# Patient Record
Sex: Male | Born: 1965 | Race: Black or African American | Hispanic: No | Marital: Married | Smoking: Current every day smoker
Health system: Southern US, Community
[De-identification: ages and names within clinical notes are randomized; demographics above are authoritative.]

## PROBLEM LIST (undated history)

## (undated) ENCOUNTER — Emergency Department (HOSPITAL_COMMUNITY): Admission: EM | Payer: Self-pay | Source: Home / Self Care

## (undated) DIAGNOSIS — K219 Gastro-esophageal reflux disease without esophagitis: Secondary | ICD-10-CM

## (undated) DIAGNOSIS — I10 Essential (primary) hypertension: Secondary | ICD-10-CM

## (undated) HISTORY — PX: KNEE SURGERY: SHX244

## (undated) HISTORY — PX: APPENDECTOMY: SHX54

## (undated) HISTORY — DX: Gastro-esophageal reflux disease without esophagitis: K21.9

---
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
## 2010-08-29 ENCOUNTER — Emergency Department (HOSPITAL_COMMUNITY)
Admission: EM | Admit: 2010-08-29 | Discharge: 2010-08-29 | Disposition: A | Discharge status: Home / Self Care | Payer: Self-pay | Attending: Emergency Medicine | Admitting: Emergency Medicine

## 2010-08-29 DIAGNOSIS — Z202 Contact with and (suspected) exposure to infections with a predominantly sexual mode of transmission: Secondary | ICD-10-CM | POA: Insufficient documentation

## 2010-11-27 ENCOUNTER — Inpatient Hospital Stay (INDEPENDENT_AMBULATORY_CARE_PROVIDER_SITE_OTHER)
Admission: RE | Admit: 2010-11-27 | Discharge: 2010-11-27 | Disposition: A | Discharge status: Home / Self Care | Payer: Self-pay | Source: Ambulatory Visit | Attending: Family Medicine | Admitting: Family Medicine

## 2010-11-27 DIAGNOSIS — L259 Unspecified contact dermatitis, unspecified cause: Secondary | ICD-10-CM

## 2010-11-27 DIAGNOSIS — L988 Other specified disorders of the skin and subcutaneous tissue: Secondary | ICD-10-CM

## 2011-01-21 ENCOUNTER — Ambulatory Visit (INDEPENDENT_AMBULATORY_CARE_PROVIDER_SITE_OTHER): Payer: Self-pay

## 2011-01-21 ENCOUNTER — Inpatient Hospital Stay (INDEPENDENT_AMBULATORY_CARE_PROVIDER_SITE_OTHER)
Admission: RE | Admit: 2011-01-21 | Discharge: 2011-01-21 | Disposition: A | Discharge status: Home / Self Care | Payer: Self-pay | Source: Ambulatory Visit | Attending: Emergency Medicine | Admitting: Emergency Medicine

## 2011-01-21 DIAGNOSIS — IMO0002 Reserved for concepts with insufficient information to code with codable children: Secondary | ICD-10-CM

## 2011-01-21 DIAGNOSIS — S8000XA Contusion of unspecified knee, initial encounter: Secondary | ICD-10-CM

## 2011-01-23 ENCOUNTER — Inpatient Hospital Stay (HOSPITAL_COMMUNITY)
Admission: RE | Admit: 2011-01-23 | Discharge: 2011-01-23 | Disposition: A | Discharge status: Home / Self Care | Payer: Self-pay | Source: Ambulatory Visit

## 2011-04-12 ENCOUNTER — Emergency Department (HOSPITAL_COMMUNITY)
Admission: EM | Admit: 2011-04-12 | Discharge: 2011-04-13 | Disposition: A | Discharge status: Home / Self Care | Payer: Self-pay | Attending: Emergency Medicine | Admitting: Emergency Medicine

## 2011-04-12 DIAGNOSIS — R059 Cough, unspecified: Secondary | ICD-10-CM | POA: Insufficient documentation

## 2011-04-12 DIAGNOSIS — R071 Chest pain on breathing: Secondary | ICD-10-CM | POA: Insufficient documentation

## 2011-04-12 DIAGNOSIS — Z87828 Personal history of other (healed) physical injury and trauma: Secondary | ICD-10-CM | POA: Insufficient documentation

## 2011-04-12 DIAGNOSIS — R05 Cough: Secondary | ICD-10-CM | POA: Insufficient documentation

## 2011-04-12 DIAGNOSIS — Z9889 Other specified postprocedural states: Secondary | ICD-10-CM | POA: Insufficient documentation

## 2011-04-12 DIAGNOSIS — R093 Abnormal sputum: Secondary | ICD-10-CM | POA: Insufficient documentation

## 2011-04-13 ENCOUNTER — Emergency Department (HOSPITAL_COMMUNITY): Payer: Self-pay

## 2016-01-09 ENCOUNTER — Encounter (HOSPITAL_COMMUNITY): Payer: Self-pay | Admitting: Emergency Medicine

## 2016-01-09 ENCOUNTER — Ambulatory Visit (HOSPITAL_COMMUNITY)
Admission: EM | Admit: 2016-01-09 | Discharge: 2016-01-09 | Disposition: A | Discharge status: Home / Self Care | Payer: Self-pay | Attending: Emergency Medicine | Admitting: Emergency Medicine

## 2016-01-09 DIAGNOSIS — I159 Secondary hypertension, unspecified: Secondary | ICD-10-CM

## 2016-01-09 DIAGNOSIS — Z760 Encounter for issue of repeat prescription: Secondary | ICD-10-CM

## 2016-01-09 HISTORY — DX: Essential (primary) hypertension: I10

## 2016-01-09 MED ORDER — HYDROCHLOROTHIAZIDE 25 MG PO TABS: 25.0000 mg | ORAL_TABLET | Freq: Every day | ORAL | Status: DC

## 2016-01-09 NOTE — ED Notes (Signed)
Diagnosed with htn 1999.  Patient released from prison 5 days ago.  Out of medicine for 4 days.  Says jail could not find his medicine

## 2016-01-09 NOTE — Discharge Instructions (Signed)
If you need help with getting financial assistance, assistance in getting medications or finding a primary care physican, contact Maggy Mena here at the Urgent [facility]. Her phone number is [phone].   [facility] [phone] [address] [place] [id number]  [facility]: [address] [place] [id number]  [phone]  [facility]: [address] [place] [id number]   [phone]  [facility]: [address] [place] [address]  [phone]  [facility] : [address] [place] [id number] [phone]  [facility]: [address] [place] [phone]  [facility]: [address] [place] [id number] [phone]  Dr. Oneal GroutMahima Pandey [address] [facility] [place] [id number]  [phone]  Dr. Jackie PlumGeorge Osei-Bonsu, [facility]. [address] [place] [address]  [phone]     Decrease your salt intake. diet and exercise will lower your blood pressure significantly. It is important to keep your blood pressure under good control, as having a elevated for prolonged periods of time significantly increases your risk of stroke, heart attacks, kidney damage, eye damage, and other problems. Return here in a week for blood pressure recheck if you're able to find a primary care physician by then. Return immediately to the ER if you start having chest pain, headache, problems seeing, problems talking, problems walking, if you feel like you're about to pass out, if you do pass out, if you have a seizure, or for any other concerns..Marland Kitchen

## 2016-01-09 NOTE — ED Provider Notes (Signed)
  HPI  SUBJECTIVE:  Bruce [name] is a 50 y.o. male who presents for medication refill. Patient states that he ran out of his hydrochlorothiazide 4 days ago. States he has been on this for a long time in the controls his hypertension well. Today he is asymptomatic. He denies headache, visual changes, dysarthria, discoordination, arm or leg weakness, facial droop, chest pain, shortness of breath, abdominal pain, pain tearing through to his back, he anuria, hematuria, lower extremity edema, seizures, syncope. States he just got out of jail, and the present lost his prescription. Past medical history of hypertension, gout. No history of stroke, MI. No illicit drugs. PMD: None.    Past Medical History  Diagnosis Date  . Hypertension     Past Surgical History  Procedure Laterality Date  . Appendectomy    . Knee surgery Bilateral     Family History  Problem Relation Age of Onset  . Hypertension Mother   . Hypertension Father     Social History  Substance Use Topics  . Smoking status: Current Every Day Smoker  . Smokeless tobacco: None  . Alcohol Use: Yes    No current facility-administered medications for this encounter.  Current outpatient prescriptions:  .  hydrochlorothiazide (HYDRODIURIL) 25 MG tablet, Take 1 tablet (25 mg total) by mouth daily., Disp: 30 tablet, Rfl: 0  No Known Allergies   ROS  As noted in HPI.   Physical Exam  BP 145/85 mmHg  Pulse 84  Temp(Src) 98.1 F (36.7 C) (Oral)  Resp 18  SpO2 98%   BP Readings from Last 3 Encounters:  01/09/16 145/85    Constitutional: Well developed, well nourished, no acute distress Eyes: PERRL, EOMI, conjunctiva normal bilaterally HENT: Normocephalic, atraumatic,mucus membranes moist Respiratory: Clear to auscultation bilaterally, no rales, no wheezing, no rhonchi Cardiovascular: Normal rate and rhythm, no murmurs, no gallops, no rubs GI: Soft, nondistended, normal bowel sounds, nontender, no rebound, no  guarding skin: No rash, skin intact Musculoskeletal: No edema, no tenderness, no deformities Neurologic: Alert & oriented x 3, CN II-XII grossly intact, no motor deficits, sensation grossly intact Psychiatric: Speech and behavior appropriate   ED Course   Medications - No data to display  No orders of the defined types were placed in this encounter.   No results found for this or any previous visit (from the past 24 hour(s)). No results found.  ED Clinical Impression  Secondary hypertension, unspecified  Medication refill   ED Assessment/Plan  Patient is symptomatic. States the HCTZ controls his blood pressure well. We'll restart this, have him keep a log of his blood pressures, Mat CarneMaggie Mina is helping him find a primary care physician. We'll also give a list of primary care practices.  Discussed  MDM, plan and followup with patient. Discussed sn/sx that should prompt return to the ED. Patient  agrees with plan.  *This clinic note was created using Dragon dictation software. Therefore, there may be occasional mistakes despite careful proofreading.  ?  Domenick GongAshley Chayce Rullo, MD 01/10/16 [phone]

## 2016-02-22 ENCOUNTER — Ambulatory Visit (HOSPITAL_COMMUNITY)
Admission: EM | Admit: 2016-02-22 | Discharge: 2016-02-22 | Disposition: A | Discharge status: Home / Self Care | Payer: Self-pay | Attending: Family Medicine | Admitting: Family Medicine

## 2016-02-22 ENCOUNTER — Encounter (HOSPITAL_COMMUNITY): Payer: Self-pay | Admitting: Emergency Medicine

## 2016-02-22 DIAGNOSIS — E860 Dehydration: Secondary | ICD-10-CM

## 2016-02-22 DIAGNOSIS — K2970 Gastritis, unspecified, without bleeding: Secondary | ICD-10-CM

## 2016-02-22 MED ORDER — HYDROCHLOROTHIAZIDE 25 MG PO TABS: 25.0000 mg | ORAL_TABLET | Freq: Every day | ORAL | 1 refills | Status: DC

## 2016-02-22 MED ORDER — ONDANSETRON HCL 4 MG PO TABS: 4.0000 mg | ORAL_TABLET | Freq: Four times a day (QID) | ORAL | 0 refills | Status: DC

## 2016-02-22 NOTE — ED Triage Notes (Addendum)
The patient presented to the [facility] with a complaint of N/V/D for the last 36 hours. The patient also requested a refill of his HCTZ.

## 2016-02-22 NOTE — ED Provider Notes (Signed)
CSN: [id number]     Arrival date & time 02/22/16  4098 History   First MD Initiated Contact with Patient 02/22/16 1027     Chief Complaint  Patient presents with  . Nausea   (Consider location/radiation/quality/duration/timing/severity/associated sxs/prior Treatment) HPI Patient is a 50 year old male who started with nausea vomiting 2 days ago. He still complains of nausea although the vomiting has ceased at this time. He states that he is having difficulty taking fluids because he is so nauseous. He is not eating at this time. He denies any significant abdominal pain. Denies fever or blood in his vomitus. He does have a history of hypertension. He is also out of his hydrochlorothiazide and is asking for a refill for this. Past Medical History:  Diagnosis Date  . Hypertension    Past Surgical History:  Procedure Laterality Date  . APPENDECTOMY    . KNEE SURGERY Bilateral    Family History  Problem Relation Age of Onset  . Hypertension Mother   . Hypertension Father    Social History  Substance Use Topics  . Smoking status: Current Every Day Smoker  . Smokeless tobacco: Never Used  . Alcohol use Yes    Review of Systems  Denies: HEADACHE,   ABDOMINAL PAIN, CHEST PAIN, CONGESTION, DYSURIA, SHORTNESS OF BREATH  Allergies  Review of patient's allergies indicates no known allergies.  Home Medications   Prior to Admission medications   Medication Sig Start Date End Date Taking? Authorizing Provider  hydrochlorothiazide (HYDRODIURIL) 25 MG tablet Take 1 tablet (25 mg total) by mouth daily. 01/09/16  Yes Domenick Gong, MD  hydrochlorothiazide (HYDRODIURIL) 25 MG tablet Take 1 tablet (25 mg total) by mouth daily. 02/22/16   Tharon Aquas, PA  ondansetron (ZOFRAN) 4 MG tablet Take 1 tablet (4 mg total) by mouth every 6 (six) hours. 02/22/16   Tharon Aquas, PA   Meds Ordered and Administered this Visit  Medications - No data to display  There were no vitals taken for this  visit. No data found.   Physical Exam NURSES NOTES AND VITAL SIGNS REVIEWED. CONSTITUTIONAL: Well developed, well nourished, no acute distress HEENT: normocephalic, atraumatic EYES: Conjunctiva normal NECK:normal ROM, supple, no adenopathy PULMONARY:No respiratory distress, normal effort ABDOMINAL: Soft, ND, NT BS+, No CVAT MUSCULOSKELETAL: Normal ROM of all extremities,  SKIN: warm and dry without rash PSYCHIATRIC: Mood and affect, behavior are normal  Urgent Care Course   Clinical Course    Procedures (including critical care time)  Labs Review Labs Reviewed - No data to display  Imaging Review No results found.   Visual Acuity Review  Right Eye Distance:   Left Eye Distance:   Bilateral Distance:    Right Eye Near:   Left Eye Near:    Bilateral Near:       Although patient meets criteria for clinical diagnosis of dehydration he is alert and oriented to this time and does not require parenteral hydration. I think after taking the Zofran he should be able to orally hydrate himself properly. He is advised that if there are new or worsening symptoms he should go to the emergency department for reevaluation. Prescriptions for hydrochlorothiazide and Zofran are provided to the patient. MDM   1. Gastritis   2. Dehydration     Patient is reassured that there are no issues that require transfer to higher level of care at this time or additional tests. Patient is advised to continue home symptomatic treatment. Patient is advised that if there  are new or worsening symptoms to attend the emergency department, contact primary care provider, or return to UC. Instructions of care provided discharged home in stable condition.    THIS NOTE WAS GENERATED USING A VOICE RECOGNITION SOFTWARE PROGRAM. ALL REASONABLE EFFORTS  WERE MADE TO PROOFREAD THIS DOCUMENT FOR ACCURACY.  I have verbally reviewed the discharge instructions with the patient. A printed AVS was given to the  patient.  All questions were answered prior to discharge.      Tharon Aquas, PA 02/22/16 1118

## 2016-02-25 ENCOUNTER — Encounter (HOSPITAL_COMMUNITY): Payer: Self-pay

## 2016-02-25 ENCOUNTER — Emergency Department (HOSPITAL_COMMUNITY)
Admission: EM | Admit: 2016-02-25 | Discharge: 2016-02-25 | Disposition: A | Discharge status: Home / Self Care | Payer: Self-pay | Attending: Emergency Medicine | Admitting: Emergency Medicine

## 2016-02-25 DIAGNOSIS — F17210 Nicotine dependence, cigarettes, uncomplicated: Secondary | ICD-10-CM | POA: Insufficient documentation

## 2016-02-25 DIAGNOSIS — R112 Nausea with vomiting, unspecified: Secondary | ICD-10-CM | POA: Insufficient documentation

## 2016-02-25 DIAGNOSIS — E860 Dehydration: Secondary | ICD-10-CM | POA: Insufficient documentation

## 2016-02-25 DIAGNOSIS — I10 Essential (primary) hypertension: Secondary | ICD-10-CM | POA: Insufficient documentation

## 2016-02-25 LAB — COMPREHENSIVE METABOLIC PANEL
ALK PHOS: 69 U/L (ref 38–126)
ALT: 20 U/L (ref 17–63)
AST: 24 U/L (ref 15–41)
Albumin: 4.2 g/dL (ref 3.5–5.0)
Anion gap: 11 (ref 5–15)
BILIRUBIN TOTAL: 1.4 mg/dL — AB (ref 0.3–1.2)
BUN: 14 mg/dL (ref 6–20)
CALCIUM: 9.6 mg/dL (ref 8.9–10.3)
CO2: 26 mmol/L (ref 22–32)
CREATININE: 1.3 mg/dL — AB (ref 0.61–1.24)
Chloride: 101 mmol/L (ref 101–111)
Glucose, Bld: 128 mg/dL — ABNORMAL HIGH (ref 65–99)
Potassium: 3 mmol/L — ABNORMAL LOW (ref 3.5–5.1)
Sodium: 138 mmol/L (ref 135–145)
Total Protein: 7.6 g/dL (ref 6.5–8.1)

## 2016-02-25 LAB — URINALYSIS, ROUTINE W REFLEX MICROSCOPIC
Glucose, UA: NEGATIVE mg/dL
HGB URINE DIPSTICK: NEGATIVE
KETONES UR: 15 mg/dL — AB
LEUKOCYTES UA: NEGATIVE
Nitrite: NEGATIVE
PROTEIN: NEGATIVE mg/dL
Specific Gravity, Urine: 1.031 — ABNORMAL HIGH (ref 1.005–1.030)
pH: 6.5 (ref 5.0–8.0)

## 2016-02-25 LAB — CBC
HCT: 50.1 % (ref 39.0–52.0)
Hemoglobin: 17.8 g/dL — ABNORMAL HIGH (ref 13.0–17.0)
MCH: 33.3 pg (ref 26.0–34.0)
MCHC: 35.5 g/dL (ref 30.0–36.0)
MCV: 93.8 fL (ref 78.0–100.0)
PLATELETS: 234 10*3/uL (ref 150–400)
RBC: 5.34 MIL/uL (ref 4.22–5.81)
RDW: 13.7 % (ref 11.5–15.5)
WBC: 12.4 10*3/uL — AB (ref 4.0–10.5)

## 2016-02-25 LAB — LIPASE, BLOOD: Lipase: 23 U/L (ref 11–51)

## 2016-02-25 MED ORDER — POTASSIUM CHLORIDE CRYS ER 20 MEQ PO TBCR
40.0000 meq | EXTENDED_RELEASE_TABLET | Freq: Once | ORAL | Status: AC
  Administered 2016-02-25: 40 meq via ORAL
  Filled 2016-02-25: qty 2

## 2016-02-25 MED ORDER — ALBUTEROL SULFATE (2.5 MG/3ML) 0.083% IN NEBU
2.5000 mg | INHALATION_SOLUTION | Freq: Once | RESPIRATORY_TRACT | Status: AC
  Administered 2016-02-25: 2.5 mg via RESPIRATORY_TRACT
  Filled 2016-02-25: qty 3

## 2016-02-25 MED ORDER — PROMETHAZINE HCL 25 MG/ML IJ SOLN
25.0000 mg | Freq: Once | INTRAMUSCULAR | Status: AC
  Administered 2016-02-25: 25 mg via INTRAVENOUS
  Filled 2016-02-25: qty 1

## 2016-02-25 MED ORDER — SODIUM CHLORIDE 0.9 % IV BOLUS (SEPSIS)
1000.0000 mL | Freq: Once | INTRAVENOUS | Status: AC
  Administered 2016-02-25: 1000 mL via INTRAVENOUS

## 2016-02-25 MED ORDER — ONDANSETRON 4 MG PO TBDP
ORAL_TABLET | ORAL | Status: DC
Start: 2016-02-25 — End: 2016-02-26
  Filled 2016-02-25: qty 1

## 2016-02-25 MED ORDER — PROMETHAZINE HCL 25 MG PO TABS: 25.0000 mg | ORAL_TABLET | Freq: Four times a day (QID) | ORAL | 0 refills | Status: DC | PRN

## 2016-02-25 MED ORDER — ONDANSETRON 4 MG PO TBDP
4.0000 mg | ORAL_TABLET | Freq: Once | ORAL | Status: AC
  Administered 2016-02-25: 4 mg via ORAL

## 2016-02-25 MED ORDER — ONDANSETRON HCL 4 MG/2ML IJ SOLN
4.0000 mg | Freq: Once | INTRAMUSCULAR | Status: AC
  Administered 2016-02-25: 4 mg via INTRAVENOUS
  Filled 2016-02-25: qty 2

## 2016-02-25 NOTE — ED Triage Notes (Signed)
To triage via EMS.  Onset 02-20-16 nausea, vomiting, diarrhea.  Vomited x 4 today, last diarrhea last night.  Seen at urgent care 02-22-16 was given prescription for blood pressure med and Zofran.  Pt taking alka seltzer for allergies d/t sinus drainage issues r/t chronic nasal issues.
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
## 2016-02-25 NOTE — ED Notes (Signed)
Family at bedside. 

## 2016-02-25 NOTE — ED Notes (Signed)
Pt departed in NAD, refused use of wheelchair.  

## 2016-02-25 NOTE — ED Notes (Signed)
Pt's nausea reassessed at this time.  Pt reports continued nausea at this time but states that he has been able "to drink some soda and it hasn't come back up yet."

## 2016-05-05 ENCOUNTER — Emergency Department (HOSPITAL_COMMUNITY)
Admission: EM | Admit: 2016-05-05 | Discharge: 2016-05-05 | Disposition: A | Discharge status: Home / Self Care | Payer: Self-pay | Attending: Emergency Medicine | Admitting: Emergency Medicine

## 2016-05-05 ENCOUNTER — Encounter (HOSPITAL_COMMUNITY): Payer: Self-pay | Admitting: Emergency Medicine

## 2016-05-05 DIAGNOSIS — R1084 Generalized abdominal pain: Secondary | ICD-10-CM | POA: Insufficient documentation

## 2016-05-05 DIAGNOSIS — I10 Essential (primary) hypertension: Secondary | ICD-10-CM | POA: Insufficient documentation

## 2016-05-05 DIAGNOSIS — F17210 Nicotine dependence, cigarettes, uncomplicated: Secondary | ICD-10-CM | POA: Insufficient documentation

## 2016-05-05 DIAGNOSIS — R030 Elevated blood-pressure reading, without diagnosis of hypertension: Secondary | ICD-10-CM

## 2016-05-05 LAB — COMPREHENSIVE METABOLIC PANEL
ALBUMIN: 4 g/dL (ref 3.5–5.0)
ALK PHOS: 66 U/L (ref 38–126)
ALT: 29 U/L (ref 17–63)
AST: 27 U/L (ref 15–41)
Anion gap: 10 (ref 5–15)
BILIRUBIN TOTAL: 1.2 mg/dL (ref 0.3–1.2)
BUN: 14 mg/dL (ref 6–20)
CALCIUM: 9.5 mg/dL (ref 8.9–10.3)
CO2: 31 mmol/L (ref 22–32)
Chloride: 95 mmol/L — ABNORMAL LOW (ref 101–111)
Creatinine, Ser: 1.34 mg/dL — ABNORMAL HIGH (ref 0.61–1.24)
GFR calc Af Amer: >60 mL/min (ref >60)
GFR calc non Af Amer: >60 mL/min (ref >60)
GLUCOSE: 119 mg/dL — AB (ref 65–99)
POTASSIUM: 3.5 mmol/L (ref 3.5–5.1)
SODIUM: 136 mmol/L (ref 135–145)
TOTAL PROTEIN: 7.5 g/dL (ref 6.5–8.1)

## 2016-05-05 LAB — URINALYSIS, ROUTINE W REFLEX MICROSCOPIC
Bilirubin Urine: NEGATIVE
GLUCOSE, UA: NEGATIVE mg/dL
HGB URINE DIPSTICK: NEGATIVE
Ketones, ur: NEGATIVE mg/dL
LEUKOCYTES UA: NEGATIVE
Nitrite: NEGATIVE
Protein, ur: NEGATIVE mg/dL
SPECIFIC GRAVITY, URINE: 1.024 (ref 1.005–1.030)
pH: 6 (ref 5.0–8.0)

## 2016-05-05 LAB — CBC
HEMATOCRIT: 48.2 % (ref 39.0–52.0)
HEMOGLOBIN: 17.3 g/dL — AB (ref 13.0–17.0)
MCH: 33.5 pg (ref 26.0–34.0)
MCHC: 35.9 g/dL (ref 30.0–36.0)
MCV: 93.2 fL (ref 78.0–100.0)
Platelets: 245 10*3/uL (ref 150–400)
RBC: 5.17 MIL/uL (ref 4.22–5.81)
RDW: 12.6 % (ref 11.5–15.5)
WBC: 12.3 10*3/uL — ABNORMAL HIGH (ref 4.0–10.5)

## 2016-05-05 LAB — CBG MONITORING, ED: Glucose-Capillary: 101 mg/dL — ABNORMAL HIGH (ref 65–99)

## 2016-05-05 LAB — I-STAT CG4 LACTIC ACID, ED: LACTIC ACID, VENOUS: 0.78 mmol/L (ref 0.5–1.9)

## 2016-05-05 LAB — LIPASE, BLOOD: Lipase: 16 U/L (ref 11–51)

## 2016-05-05 MED ORDER — GI COCKTAIL ~~LOC~~
30.0000 mL | Freq: Once | ORAL | Status: AC
  Administered 2016-05-05: 30 mL via ORAL
  Filled 2016-05-05: qty 30

## 2016-05-05 MED ORDER — SUCRALFATE 1 GM/10ML PO SUSP: 1.0000 g | Freq: Three times a day (TID) | ORAL | 0 refills | Status: DC

## 2016-05-05 MED ORDER — FAMOTIDINE IN NACL 20-0.9 MG/50ML-% IV SOLN
20.0000 mg | Freq: Once | INTRAVENOUS | Status: AC
  Administered 2016-05-05: 20 mg via INTRAVENOUS
  Filled 2016-05-05: qty 50

## 2016-05-05 MED ORDER — FAMOTIDINE 20 MG PO TABS: 20.0000 mg | ORAL_TABLET | Freq: Two times a day (BID) | ORAL | 0 refills | Status: DC

## 2016-05-05 MED ORDER — ONDANSETRON HCL 4 MG/2ML IJ SOLN
4.0000 mg | Freq: Once | INTRAMUSCULAR | Status: AC
  Administered 2016-05-05: 4 mg via INTRAVENOUS
  Filled 2016-05-05: qty 2

## 2016-05-05 MED ORDER — DICYCLOMINE HCL 10 MG PO CAPS: 20.0000 mg | ORAL_CAPSULE | Freq: Four times a day (QID) | ORAL | 0 refills | Status: DC | PRN

## 2016-05-05 MED ORDER — DICYCLOMINE HCL 10 MG PO CAPS
10.0000 mg | ORAL_CAPSULE | Freq: Once | ORAL | Status: AC
  Administered 2016-05-05: 10 mg via ORAL
  Filled 2016-05-05: qty 1
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
## 2016-05-05 NOTE — ED Triage Notes (Signed)
Pt states last Friday he started vomiting a lot. Pt has been nauseated since then and has not been able to eat much. Pt states he started feeling better yesterday. This morning, pt states he drank Gatorade and ate a fruit cup. Pt states he started sweating, feeling nauseous again. Pt concerned he has high BP and CBG because it runs in his family.

## 2016-05-05 NOTE — Discharge Instructions (Signed)
Do not take any NSAIDs (motrin, ibuprofen, advil, aleve, excedrin, aspirin, naproxen, goody's powder,  etc.) because they can further irritate your stomach.  ° °Please follow with your primary care doctor in the next 2 days for a check-up. They must obtain records for further management.  ° °Do not hesitate to return to the Emergency Department for any new, worsening or concerning symptoms.  ° ° °

## 2016-05-05 NOTE — ED Notes (Signed)
Patient had nausea/vomiting 3 days ago. Currently he denies abdominal pain and nausea. But just not feeling quite right. He was able to tolerate regular dinner last night. Concerned about blood sugar d/t DM running in family.

## 2016-05-05 NOTE — Care Management Note (Signed)
Case Management Note  Patient Details  Name: Bruce [name] MRN: [id number] Date of Birth: 02/13/1966  Subjective/Objective:  50 y.o. [name] seen in the ED for abdominal pain. CM received referral for PCP.                   Action/Plan: Referred to [facility]. Pt understands that he can go to open clinic hours on Thursdays @0830  and will be prepared to stay until he is seen. No further CM needs at this time.    Expected Discharge Date:                  Expected Discharge Plan:     In-House Referral:     Discharge planning Services     Post Acute Care Choice:    Choice offered to:     DME Arranged:    DME Agency:     HH Arranged:    HH Agency:     Status of Service:     If discussed at [employer] Meetings, dates discussed:    Additional Comments:  Bruce [name], Bruce Yott M, RN 05/05/2016, 11:45 AM

## 2016-05-05 NOTE — ED Notes (Signed)
CBG 101 

## 2018-11-12 ENCOUNTER — Ambulatory Visit (HOSPITAL_COMMUNITY)
Admission: EM | Admit: 2018-11-12 | Discharge: 2018-11-12 | Disposition: A | Discharge status: Home / Self Care | Payer: BLUE CROSS/BLUE SHIELD

## 2018-11-12 ENCOUNTER — Encounter (HOSPITAL_COMMUNITY): Payer: Self-pay | Admitting: Family Medicine

## 2018-11-12 ENCOUNTER — Other Ambulatory Visit: Payer: Self-pay

## 2018-11-12 DIAGNOSIS — I10 Essential (primary) hypertension: Secondary | ICD-10-CM

## 2018-11-12 MED ORDER — LISINOPRIL-HYDROCHLOROTHIAZIDE 20-12.5 MG PO TABS: 1.0000 | ORAL_TABLET | Freq: Every day | ORAL | 2 refills | Status: DC

## 2018-11-12 MED ORDER — CETIRIZINE HCL 10 MG PO TABS: 10.0000 mg | ORAL_TABLET | Freq: Every day | ORAL | 0 refills | Status: DC

## 2018-11-12 NOTE — Discharge Instructions (Addendum)
We are refilling your BP meds and 2 refills Giving you a contact for primary care Zyrtec for allergies.
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
## 2018-11-12 NOTE — ED Triage Notes (Signed)
Patient presents to Urgent Care with complaints of productive cough and need for a medication refill since 2 days ago. Patient states he just got out of prison and needs his lisinopril refilled.

## 2018-11-12 NOTE — ED Notes (Signed)
Patient verbalizes understanding of discharge instructions. Opportunity for questioning and answers were provided. Patient discharged from [facility] by provider.  

## 2019-05-01 ENCOUNTER — Encounter (HOSPITAL_COMMUNITY): Payer: Self-pay

## 2019-05-01 ENCOUNTER — Other Ambulatory Visit: Payer: Self-pay

## 2019-05-01 ENCOUNTER — Ambulatory Visit (HOSPITAL_COMMUNITY)
Admission: EM | Admit: 2019-05-01 | Discharge: 2019-05-01 | Disposition: A | Discharge status: Home / Self Care | Payer: BLUE CROSS/BLUE SHIELD | Attending: Family Medicine | Admitting: Family Medicine

## 2019-05-01 DIAGNOSIS — Z760 Encounter for issue of repeat prescription: Secondary | ICD-10-CM | POA: Diagnosis present

## 2019-05-01 DIAGNOSIS — J302 Other seasonal allergic rhinitis: Secondary | ICD-10-CM | POA: Diagnosis not present

## 2019-05-01 DIAGNOSIS — Z20828 Contact with and (suspected) exposure to other viral communicable diseases: Secondary | ICD-10-CM | POA: Diagnosis present

## 2019-05-01 DIAGNOSIS — I10 Essential (primary) hypertension: Secondary | ICD-10-CM | POA: Insufficient documentation

## 2019-05-01 DIAGNOSIS — R062 Wheezing: Secondary | ICD-10-CM | POA: Diagnosis present

## 2019-05-01 MED ORDER — LISINOPRIL-HYDROCHLOROTHIAZIDE 20-12.5 MG PO TABS: 1.0000 | ORAL_TABLET | Freq: Every day | ORAL | 0 refills | Status: DC

## 2019-05-01 MED ORDER — ALBUTEROL SULFATE HFA 108 (90 BASE) MCG/ACT IN AERS: 2.0000 | INHALATION_SPRAY | RESPIRATORY_TRACT | 0 refills | Status: DC | PRN

## 2019-05-01 MED ORDER — CETIRIZINE HCL 10 MG PO TABS: 10.0000 mg | ORAL_TABLET | Freq: Every day | ORAL | 0 refills | Status: DC
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
## 2019-05-01 NOTE — ED Triage Notes (Signed)
Patient presents to Urgent Care with complaints of needing a refill on his blood pressure medication since running out over a month ago. Patient reports he also has a cough with nasal drainage that he says he gets every year around this time.

## 2019-05-01 NOTE — Discharge Instructions (Signed)
Follow up with PCP for management of your blood pressure.  Take medication as prescribed. Go to ER if you experience acute headache, weakness, numbness, or tingling, especially on 1/2 of your body. Lab results will be available via MyChart in 2 - 3 days.

## 2019-05-03 LAB — NOVEL CORONAVIRUS, NAA (HOSP ORDER, SEND-OUT TO REF LAB; TAT 18-24 HRS): SARS-CoV-2, NAA: NOT DETECTED

## 2019-05-26 ENCOUNTER — Ambulatory Visit: Payer: BLUE CROSS/BLUE SHIELD

## 2019-10-02 ENCOUNTER — Emergency Department (HOSPITAL_COMMUNITY): Payer: BLUE CROSS/BLUE SHIELD

## 2019-10-02 ENCOUNTER — Encounter (HOSPITAL_COMMUNITY): Payer: Self-pay | Admitting: Emergency Medicine

## 2019-10-02 ENCOUNTER — Other Ambulatory Visit: Payer: Self-pay

## 2019-10-02 ENCOUNTER — Emergency Department (HOSPITAL_COMMUNITY)
Admission: EM | Admit: 2019-10-02 | Discharge: 2019-10-02 | Disposition: A | Discharge status: Home / Self Care | Payer: BLUE CROSS/BLUE SHIELD | Attending: Emergency Medicine | Admitting: Emergency Medicine

## 2019-10-02 DIAGNOSIS — Z79899 Other long term (current) drug therapy: Secondary | ICD-10-CM | POA: Insufficient documentation

## 2019-10-02 DIAGNOSIS — Z20822 Contact with and (suspected) exposure to covid-19: Secondary | ICD-10-CM | POA: Diagnosis not present

## 2019-10-02 DIAGNOSIS — I10 Essential (primary) hypertension: Secondary | ICD-10-CM | POA: Diagnosis not present

## 2019-10-02 DIAGNOSIS — R05 Cough: Secondary | ICD-10-CM | POA: Insufficient documentation

## 2019-10-02 DIAGNOSIS — F17210 Nicotine dependence, cigarettes, uncomplicated: Secondary | ICD-10-CM | POA: Insufficient documentation

## 2019-10-02 LAB — COMPREHENSIVE METABOLIC PANEL
ALT: 33 U/L (ref 0–44)
AST: 32 U/L (ref 15–41)
Albumin: 4.3 g/dL (ref 3.5–5.0)
Alkaline Phosphatase: 86 U/L (ref 38–126)
Anion gap: 13 (ref 5–15)
BUN: 15 mg/dL (ref 6–20)
CO2: 23 mmol/L (ref 22–32)
Calcium: 10.1 mg/dL (ref 8.9–10.3)
Chloride: 106 mmol/L (ref 98–111)
Creatinine, Ser: 1.29 mg/dL — ABNORMAL HIGH (ref 0.61–1.24)
GFR calc Af Amer: >60 mL/min (ref >60)
GFR calc non Af Amer: >60 mL/min (ref >60)
Glucose, Bld: 128 mg/dL — ABNORMAL HIGH (ref 70–99)
Potassium: 4.1 mmol/L (ref 3.5–5.1)
Sodium: 142 mmol/L (ref 135–145)
Total Bilirubin: 1.1 mg/dL (ref 0.3–1.2)
Total Protein: 7.5 g/dL (ref 6.5–8.1)

## 2019-10-02 LAB — URINALYSIS, ROUTINE W REFLEX MICROSCOPIC
Bilirubin Urine: NEGATIVE
Glucose, UA: NEGATIVE mg/dL
Ketones, ur: NEGATIVE mg/dL
Leukocytes,Ua: NEGATIVE
Nitrite: NEGATIVE
Protein, ur: 30 mg/dL — AB
Specific Gravity, Urine: 1.025 (ref 1.005–1.030)
pH: 5 (ref 5.0–8.0)

## 2019-10-02 LAB — CBC
HCT: 49.1 % (ref 39.0–52.0)
Hemoglobin: 16.3 g/dL (ref 13.0–17.0)
MCH: 32.9 pg (ref 26.0–34.0)
MCHC: 33.2 g/dL (ref 30.0–36.0)
MCV: 99.2 fL (ref 80.0–100.0)
Platelets: 246 10*3/uL (ref 150–400)
RBC: 4.95 MIL/uL (ref 4.22–5.81)
RDW: 13 % (ref 11.5–15.5)
WBC: 13.2 10*3/uL — ABNORMAL HIGH (ref 4.0–10.5)
nRBC: 0 % (ref 0.0–0.2)

## 2019-10-02 LAB — POC SARS CORONAVIRUS 2 AG -  ED: SARS Coronavirus 2 Ag: NEGATIVE

## 2019-10-02 LAB — SARS CORONAVIRUS 2 (TAT 6-24 HRS): SARS Coronavirus 2: NEGATIVE

## 2019-10-02 LAB — LIPASE, BLOOD: Lipase: 21 U/L (ref 11–51)

## 2019-10-02 MED ORDER — BENZONATATE 100 MG PO CAPS: 100.0000 mg | ORAL_CAPSULE | Freq: Three times a day (TID) | ORAL | 0 refills | Status: DC

## 2019-10-02 MED ORDER — SODIUM CHLORIDE 0.9% FLUSH
3.0000 mL | Freq: Once | INTRAVENOUS | Status: AC
  Administered 2019-10-02: 3 mL via INTRAVENOUS

## 2019-10-02 MED ORDER — SODIUM CHLORIDE 0.9 % IV BOLUS
1000.0000 mL | Freq: Once | INTRAVENOUS | Status: AC
  Administered 2019-10-02: 1000 mL via INTRAVENOUS

## 2019-10-02 MED ORDER — HALOPERIDOL LACTATE 5 MG/ML IJ SOLN
5.0000 mg | Freq: Once | INTRAMUSCULAR | Status: AC
  Administered 2019-10-02: 5 mg via INTRAVENOUS
  Filled 2019-10-02: qty 1

## 2019-10-02 MED ORDER — ONDANSETRON HCL 4 MG PO TABS: 4.0000 mg | ORAL_TABLET | Freq: Three times a day (TID) | ORAL | 0 refills | Status: DC | PRN

## 2019-10-02 MED ORDER — MORPHINE SULFATE (PF) 4 MG/ML IV SOLN
4.0000 mg | Freq: Once | INTRAVENOUS | Status: AC
  Administered 2019-10-02: 4 mg via INTRAVENOUS
  Filled 2019-10-02: qty 1

## 2019-10-02 MED ORDER — ONDANSETRON HCL 4 MG/2ML IJ SOLN
4.0000 mg | Freq: Once | INTRAMUSCULAR | Status: AC
  Administered 2019-10-02: 12:00:00 4 mg via INTRAVENOUS
  Filled 2019-10-02: qty 2

## 2019-10-02 NOTE — ED Triage Notes (Signed)
Pt with c/o emesis, chills, body aches since last night.

## 2019-10-02 NOTE — Discharge Instructions (Signed)
Your symptoms may be due to covid-19 infection.  Check your result in the next 24 hrs through MyChart, link below.  Your symptoms may also be due to marijuana use.  Avoid drug use as it may worsening your condition.  Take zofran as needed.  Drink plenty of fluid.  Return if you have any concerns.

## 2019-10-02 NOTE — ED Notes (Signed)
Pt awake and alert with recurrent vomiting.  PA notified.
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
## 2019-10-06 ENCOUNTER — Encounter (HOSPITAL_COMMUNITY): Payer: Self-pay | Admitting: Emergency Medicine

## 2019-10-06 ENCOUNTER — Emergency Department (HOSPITAL_COMMUNITY): Payer: BLUE CROSS/BLUE SHIELD

## 2019-10-06 ENCOUNTER — Other Ambulatory Visit: Payer: Self-pay

## 2019-10-06 ENCOUNTER — Inpatient Hospital Stay (HOSPITAL_COMMUNITY)
Admission: EM | Admit: 2019-10-06 | Discharge: 2019-10-09 | DRG: 684 | Disposition: A | Discharge status: Home / Self Care | Payer: BLUE CROSS/BLUE SHIELD | Attending: Internal Medicine | Admitting: Internal Medicine

## 2019-10-06 DIAGNOSIS — Z8249 Family history of ischemic heart disease and other diseases of the circulatory system: Secondary | ICD-10-CM

## 2019-10-06 DIAGNOSIS — R079 Chest pain, unspecified: Secondary | ICD-10-CM | POA: Diagnosis present

## 2019-10-06 DIAGNOSIS — Z20822 Contact with and (suspected) exposure to covid-19: Secondary | ICD-10-CM | POA: Diagnosis present

## 2019-10-06 DIAGNOSIS — I10 Essential (primary) hypertension: Secondary | ICD-10-CM | POA: Diagnosis present

## 2019-10-06 DIAGNOSIS — R112 Nausea with vomiting, unspecified: Secondary | ICD-10-CM

## 2019-10-06 DIAGNOSIS — E860 Dehydration: Secondary | ICD-10-CM | POA: Diagnosis present

## 2019-10-06 DIAGNOSIS — R34 Anuria and oliguria: Secondary | ICD-10-CM | POA: Diagnosis not present

## 2019-10-06 DIAGNOSIS — Z880 Allergy status to penicillin: Secondary | ICD-10-CM

## 2019-10-06 DIAGNOSIS — K219 Gastro-esophageal reflux disease without esophagitis: Secondary | ICD-10-CM | POA: Diagnosis present

## 2019-10-06 DIAGNOSIS — F17210 Nicotine dependence, cigarettes, uncomplicated: Secondary | ICD-10-CM | POA: Diagnosis present

## 2019-10-06 DIAGNOSIS — F1290 Cannabis use, unspecified, uncomplicated: Secondary | ICD-10-CM | POA: Diagnosis present

## 2019-10-06 DIAGNOSIS — I959 Hypotension, unspecified: Secondary | ICD-10-CM | POA: Diagnosis present

## 2019-10-06 DIAGNOSIS — N179 Acute kidney failure, unspecified: Principal | ICD-10-CM | POA: Diagnosis present

## 2019-10-06 DIAGNOSIS — R070 Pain in throat: Secondary | ICD-10-CM | POA: Diagnosis present

## 2019-10-06 DIAGNOSIS — F1210 Cannabis abuse, uncomplicated: Secondary | ICD-10-CM | POA: Diagnosis not present

## 2019-10-06 LAB — URINALYSIS, ROUTINE W REFLEX MICROSCOPIC
Bacteria, UA: NONE SEEN
Bilirubin Urine: NEGATIVE
Glucose, UA: NEGATIVE mg/dL
Hgb urine dipstick: NEGATIVE
Ketones, ur: 20 mg/dL — AB
Leukocytes,Ua: NEGATIVE
Nitrite: NEGATIVE
Protein, ur: 30 mg/dL — AB
Specific Gravity, Urine: 1.02 (ref 1.005–1.030)
pH: 5 (ref 5.0–8.0)

## 2019-10-06 LAB — BASIC METABOLIC PANEL
Anion gap: 15 (ref 5–15)
BUN: 31 mg/dL — ABNORMAL HIGH (ref 6–20)
CO2: 23 mmol/L (ref 22–32)
Calcium: 9.5 mg/dL (ref 8.9–10.3)
Chloride: 97 mmol/L — ABNORMAL LOW (ref 98–111)
Creatinine, Ser: 3.06 mg/dL — ABNORMAL HIGH (ref 0.61–1.24)
GFR calc Af Amer: 26 mL/min — ABNORMAL LOW (ref >60)
GFR calc non Af Amer: 22 mL/min — ABNORMAL LOW (ref >60)
Glucose, Bld: 122 mg/dL — ABNORMAL HIGH (ref 70–99)
Potassium: 3.2 mmol/L — ABNORMAL LOW (ref 3.5–5.1)
Sodium: 135 mmol/L (ref 135–145)

## 2019-10-06 LAB — HEPATIC FUNCTION PANEL
ALT: 29 U/L (ref 0–44)
AST: 33 U/L (ref 15–41)
Albumin: 4.2 g/dL (ref 3.5–5.0)
Alkaline Phosphatase: 73 U/L (ref 38–126)
Bilirubin, Direct: <0.1 mg/dL (ref 0.0–0.2)
Total Bilirubin: 1.4 mg/dL — ABNORMAL HIGH (ref 0.3–1.2)
Total Protein: 7.7 g/dL (ref 6.5–8.1)

## 2019-10-06 LAB — CBC
HCT: 50.1 % (ref 39.0–52.0)
Hemoglobin: 17.6 g/dL — ABNORMAL HIGH (ref 13.0–17.0)
MCH: 33.1 pg (ref 26.0–34.0)
MCHC: 35.1 g/dL (ref 30.0–36.0)
MCV: 94.4 fL (ref 80.0–100.0)
Platelets: 246 10*3/uL (ref 150–400)
RBC: 5.31 MIL/uL (ref 4.22–5.81)
RDW: 12.6 % (ref 11.5–15.5)
WBC: 14.2 10*3/uL — ABNORMAL HIGH (ref 4.0–10.5)
nRBC: 0 % (ref 0.0–0.2)

## 2019-10-06 LAB — CBG MONITORING, ED: Glucose-Capillary: 119 mg/dL — ABNORMAL HIGH (ref 70–99)

## 2019-10-06 LAB — MAGNESIUM: Magnesium: 2.2 mg/dL (ref 1.7–2.4)

## 2019-10-06 MED ORDER — SODIUM CHLORIDE 0.9 % IV BOLUS
1000.0000 mL | Freq: Once | INTRAVENOUS | Status: AC
  Administered 2019-10-06: 1000 mL via INTRAVENOUS

## 2019-10-06 MED ORDER — POTASSIUM CHLORIDE 10 MEQ/100ML IV SOLN
10.0000 meq | INTRAVENOUS | Status: AC
  Administered 2019-10-06 – 2019-10-07 (×3): 10 meq via INTRAVENOUS
  Filled 2019-10-06 (×3): qty 100

## 2019-10-06 MED ORDER — HALOPERIDOL LACTATE 5 MG/ML IJ SOLN
2.0000 mg | Freq: Four times a day (QID) | INTRAMUSCULAR | Status: DC | PRN
  Administered 2019-10-07: 2 mg via INTRAVENOUS
  Filled 2019-10-06: qty 1

## 2019-10-06 MED ORDER — SODIUM CHLORIDE 0.9 % IV SOLN: INTRAVENOUS | Status: DC

## 2019-10-06 MED ORDER — ONDANSETRON HCL 4 MG/2ML IJ SOLN
4.0000 mg | Freq: Four times a day (QID) | INTRAMUSCULAR | Status: DC | PRN
  Administered 2019-10-06: 4 mg via INTRAVENOUS
  Filled 2019-10-06 (×2): qty 2

## 2019-10-06 MED ORDER — ACETAMINOPHEN 325 MG PO TABS
650.0000 mg | ORAL_TABLET | Freq: Four times a day (QID) | ORAL | Status: DC | PRN
  Filled 2019-10-06: qty 2

## 2019-10-06 MED ORDER — ONDANSETRON HCL 4 MG PO TABS: 4.0000 mg | ORAL_TABLET | Freq: Four times a day (QID) | ORAL | Status: DC | PRN

## 2019-10-06 MED ORDER — ACETAMINOPHEN 650 MG RE SUPP: 650.0000 mg | Freq: Four times a day (QID) | RECTAL | Status: DC | PRN

## 2019-10-06 MED ORDER — ENOXAPARIN SODIUM 40 MG/0.4ML ~~LOC~~ SOLN
40.0000 mg | SUBCUTANEOUS | Status: DC
  Administered 2019-10-06 – 2019-10-08 (×3): 40 mg via SUBCUTANEOUS
  Filled 2019-10-06 (×3): qty 0.4

## 2019-10-06 MED ORDER — MORPHINE SULFATE (PF) 4 MG/ML IV SOLN
4.0000 mg | Freq: Once | INTRAVENOUS | Status: AC
  Administered 2019-10-06: 4 mg via INTRAVENOUS
  Filled 2019-10-06: qty 1

## 2019-10-06 MED ORDER — LACTATED RINGERS IV SOLN: INTRAVENOUS | Status: DC

## 2019-10-06 MED ORDER — ONDANSETRON HCL 4 MG/2ML IJ SOLN
4.0000 mg | Freq: Once | INTRAMUSCULAR | Status: AC
  Administered 2019-10-06: 4 mg via INTRAVENOUS
  Filled 2019-10-06: qty 2

## 2019-10-06 MED ORDER — SODIUM CHLORIDE 0.9% FLUSH: 3.0000 mL | Freq: Once | INTRAVENOUS | Status: DC
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
## 2019-10-06 NOTE — ED Notes (Signed)
Pt taken to CT in NAD 

## 2019-10-06 NOTE — ED Triage Notes (Addendum)
Patient arrives to ED with complaints of chills, diaphoresis, fatigue, weakness and mild abdominal cramping. Patient is very diaphoretic in triage but denies major pain, emesis, diarrhea, and fevers for the past week. Patient states hes lost 27 pounds in two weeks.
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
## 2019-10-07 DIAGNOSIS — R079 Chest pain, unspecified: Secondary | ICD-10-CM

## 2019-10-07 DIAGNOSIS — F1210 Cannabis abuse, uncomplicated: Secondary | ICD-10-CM

## 2019-10-07 LAB — COMPREHENSIVE METABOLIC PANEL
ALT: 28 U/L (ref 0–44)
AST: 29 U/L (ref 15–41)
Albumin: 3.5 g/dL (ref 3.5–5.0)
Alkaline Phosphatase: 65 U/L (ref 38–126)
Anion gap: 12 (ref 5–15)
BUN: 31 mg/dL — ABNORMAL HIGH (ref 6–20)
CO2: 25 mmol/L (ref 22–32)
Calcium: 8.8 mg/dL — ABNORMAL LOW (ref 8.9–10.3)
Chloride: 100 mmol/L (ref 98–111)
Creatinine, Ser: 2.15 mg/dL — ABNORMAL HIGH (ref 0.61–1.24)
GFR calc Af Amer: 40 mL/min — ABNORMAL LOW (ref >60)
GFR calc non Af Amer: 34 mL/min — ABNORMAL LOW (ref >60)
Glucose, Bld: 103 mg/dL — ABNORMAL HIGH (ref 70–99)
Potassium: 3.9 mmol/L (ref 3.5–5.1)
Sodium: 137 mmol/L (ref 135–145)
Total Bilirubin: 1.6 mg/dL — ABNORMAL HIGH (ref 0.3–1.2)
Total Protein: 6.4 g/dL — ABNORMAL LOW (ref 6.5–8.1)

## 2019-10-07 LAB — RAPID URINE DRUG SCREEN, HOSP PERFORMED
Amphetamines: NOT DETECTED
Barbiturates: NOT DETECTED
Benzodiazepines: NOT DETECTED
Cocaine: NOT DETECTED
Opiates: POSITIVE — AB
Tetrahydrocannabinol: POSITIVE — AB

## 2019-10-07 LAB — HIV ANTIBODY (ROUTINE TESTING W REFLEX): HIV Screen 4th Generation wRfx: NONREACTIVE

## 2019-10-07 LAB — TROPONIN I (HIGH SENSITIVITY)
Troponin I (High Sensitivity): 11 ng/L (ref <18)
Troponin I (High Sensitivity): 11 ng/L (ref <18)

## 2019-10-07 LAB — SARS CORONAVIRUS 2 (TAT 6-24 HRS): SARS Coronavirus 2: NEGATIVE

## 2019-10-07 MED ORDER — ONDANSETRON HCL 4 MG/2ML IJ SOLN
4.0000 mg | Freq: Three times a day (TID) | INTRAMUSCULAR | Status: DC
  Administered 2019-10-07 – 2019-10-08 (×4): 4 mg via INTRAVENOUS
  Filled 2019-10-07 (×3): qty 2

## 2019-10-07 MED ORDER — PANTOPRAZOLE SODIUM 40 MG IV SOLR
40.0000 mg | Freq: Every day | INTRAVENOUS | Status: DC
  Administered 2019-10-07: 40 mg via INTRAVENOUS
  Filled 2019-10-07: qty 40

## 2019-10-07 MED ORDER — MENTHOL 3 MG MT LOZG
1.0000 | LOZENGE | OROMUCOSAL | Status: DC | PRN
  Filled 2019-10-07: qty 9

## 2019-10-07 NOTE — ED Notes (Addendum)
Pt with one episode of vomiting after drinking some water. Pt still endorsing nausea. Haldol given per MAR. Name/DOB verified with pt

## 2019-10-07 NOTE — ED Notes (Signed)
Lunch Tray Ordered @ 1028. 

## 2019-10-07 NOTE — Progress Notes (Signed)
PROGRESS NOTE    Bruce [name]  PXT:[id number] DOB: Dec 31, 1965 DOA: 10/06/2019 PCP: Patient, No Pcp Per   Brief Narrative:  HPI On 10/06/2019 by Dr. Lyda Perone Bruce [name] is a 54 y.o. male with medical history significant of HTN. Pt returns to ED with intractable N/V.  Unable to keep down even sips of water. Pt initially seen in ED on 3/12 (4 days ago) with N/V, cough, fatigue, chills, sweats. COVID test at that time was negative, no known exposures to COVID or other sick persons. Did endorse recent marijuana use, but no prior h/o CVS. Sent home on 3/12, symptoms persisted / worsened, back in to ED today.  Interim history Patient admitted with acute kidney injury likely secondary to dehydration along with nausea and vomiting.  Continue IV fluids. Assessment & Plan   Acute kidney injury -Secondary to dehydration from nausea and vomiting vs medications -Question if this is due to cannabinoid hyperemesis syndrome (had long discussion with patient, feels he may have used a new or different type of marijuana) -Creatinine on admission 3.06, currently down to 2.15 (baseline 1.3) -CT abdomen pelvis negative for acute pathology -UA unremarkable -Continue IV fluids -Continue to monitor BMP  Nausea and vomiting -Unknown cause, question of this is due to pain marijuana use -Continue antiemetics, scheduled and as needed -Currently n.p.o., continue IV fluids -If able, will place on clear liquid diet advance as tolerated  Essential hypertension -Hold lisinopril/HCTZ -BP was mildly soft on admission, currently stable -Continue to monitor and add medications if needed  Marijuana use -UDS positive for opiates and THC -Discussed cessation  Chest pain -EKG obtained on admission noted sinus tachycardia rate 123 -Will obtain troponin as well as repeat EKG -Chest pain was not reproducible on physical exam -?related to N/V vs GERD, have added on protonix  Throat pain -likely from  continued vomiting -will add on Cepacol lozenges  DVT Prophylaxis  lovenox  Code Status: Full  Family Communication: None at bedside  Disposition Plan: Admitted from home for N/V found to have AKI. Now with chest pain. Will continue IVF and work up. Suspect home in 1-2 days.  Consultants None  Procedures  None  Antibiotics   Anti-infectives (From admission, onward)   None      Subjective:   Bruce [name] seen and examined today.  Continues to complain of nausea from ice chips.  Has some abdominal pain and acid reflux.  Complains of some chest pain which started in his left jaw and radiated down to the left side of his chest.  Denies current shortness of breath, diarrhea or constipation, dizziness or headache.    Objective:   Vitals:   10/07/19 0015 10/07/19 0100 10/07/19 0115 10/07/19 0241  BP:  111/85 123/79 105/60  Pulse: 87   94  Resp: 17 18 17 17   Temp:      TempSrc:      SpO2: 98% 96% 96% 97%   No intake or output data in the 24 hours ending 10/07/19 0846 There were no vitals filed for this visit.  Exam  General: Well developed, well nourished, NAD, appears stated age  HEENT: NCAT, mucous membranes moist.   Cardiovascular: S1 S2 auscultated, no rubs, murmurs or gallops. Regular rate and rhythm.  Respiratory: Clear to auscultation bilaterally with equal chest rise  Abdomen: Soft, nontender, nondistended, + bowel sounds  Extremities: warm dry without cyanosis clubbing or edema  Neuro: AAOx3, nonfocal  Psych: Appropriate mood and affect, pleasant   Data Reviewed: I  have personally reviewed following labs and imaging studies  CBC: Recent Labs  Lab 10/02/19 1033 10/06/19 1742  WBC 13.2* 14.2*  HGB 16.3 17.6*  HCT 49.1 50.1  MCV 99.2 94.4  PLT 246 246   Basic Metabolic Panel: Recent Labs  Lab 10/02/19 1033 10/06/19 1742 10/07/19 0218  NA 142 135 137  K 4.1 3.2* 3.9  CL 106 97* 100  CO2 23 23 25   GLUCOSE 128* 122* 103*  BUN 15 31* 31*    CREATININE 1.29* 3.06* 2.15*  CALCIUM 10.1 9.5 8.8*  MG  --  2.2  --    GFR: Estimated Creatinine Clearance: 50.8 mL/min (A) (by C-G formula based on SCr of 2.15 mg/dL (H)). Liver Function Tests: Recent Labs  Lab 10/02/19 1033 10/06/19 1742 10/07/19 0218  AST 32 33 29  ALT 33 29 28  ALKPHOS 86 73 65  BILITOT 1.1 1.4* 1.6*  PROT 7.5 7.7 6.4*  ALBUMIN 4.3 4.2 3.5   Recent Labs  Lab 10/02/19 1033  LIPASE 21   No results for input(s): AMMONIA in the last 168 hours. Coagulation Profile: No results for input(s): INR, PROTIME in the last 168 hours. Cardiac Enzymes: No results for input(s): CKTOTAL, CKMB, CKMBINDEX, TROPONINI in the last 168 hours. BNP (last 3 results) No results for input(s): PROBNP in the last 8760 hours. HbA1C: No results for input(s): HGBA1C in the last 72 hours. CBG: Recent Labs  Lab 10/06/19 1754  GLUCAP 119*   Lipid Profile: No results for input(s): CHOL, HDL, LDLCALC, TRIG, CHOLHDL, LDLDIRECT in the last 72 hours. Thyroid Function Tests: No results for input(s): TSH, T4TOTAL, FREET4, T3FREE, THYROIDAB in the last 72 hours. Anemia Panel: No results for input(s): VITAMINB12, FOLATE, FERRITIN, TIBC, IRON, RETICCTPCT in the last 72 hours. Urine analysis:    Component Value Date/Time   COLORURINE YELLOW 10/06/2019 2250   APPEARANCEUR HAZY (A) 10/06/2019 2250   LABSPEC 1.020 10/06/2019 2250   PHURINE 5.0 10/06/2019 2250   GLUCOSEU NEGATIVE 10/06/2019 2250   HGBUR NEGATIVE 10/06/2019 2250   BILIRUBINUR NEGATIVE 10/06/2019 2250   KETONESUR 20 (A) 10/06/2019 2250   PROTEINUR 30 (A) 10/06/2019 2250   NITRITE NEGATIVE 10/06/2019 2250   LEUKOCYTESUR NEGATIVE 10/06/2019 2250   Sepsis Labs: @LABRCNTIP (procalcitonin:4,lacticidven:4)  ) Recent Results (from the past 240 hour(s))  SARS CORONAVIRUS 2 (TAT 6-24 HRS) Nasopharyngeal Nasopharyngeal Swab     Status: None   Collection Time: 10/02/19 11:13 AM   Specimen: Nasopharyngeal Swab  Result Value  Ref Range Status   SARS Coronavirus 2 NEGATIVE NEGATIVE Final    Comment: (NOTE) SARS-CoV-2 target nucleic acids are NOT DETECTED. The SARS-CoV-2 RNA is generally detectable in upper and lower respiratory specimens during the acute phase of infection. Negative results do not preclude SARS-CoV-2 infection, do not rule out co-infections with other pathogens, and should not be used as the sole basis for treatment or other patient management decisions. Negative results must be combined with clinical observations, patient history, and epidemiological information. The expected result is Negative. Fact Sheet for Patients: Fact Sheet for Healthcare Providers: 12/02/19 This test is not yet approved or cleared by the [place] FDA and  has been authorized for detection and/or diagnosis of SARS-CoV-2 by FDA under an Emergency Use Authorization (EUA). This EUA will remain  in effect (meaning this test can be used) for the duration of the COVID-19 declaration under Section 56 4(b)(1) of the Act, 21 U.S.C. section 360bbb-3(b)(1), unless the authorization is terminated or revoked sooner.  Performed at [facility], [place] [address], [place], [place] [address]       Radiology Studies: CT ABDOMEN PELVIS WO CONTRAST  Result Date: 10/06/2019 CLINICAL DATA:  Abdominal pain. EXAM: CT ABDOMEN AND PELVIS WITHOUT CONTRAST TECHNIQUE: Multidetector CT imaging of the abdomen and pelvis was performed following the standard protocol without IV contrast. COMPARISON:  None. FINDINGS: Lower chest: The lung bases are clear. The heart size is normal. Hepatobiliary: The liver is normal. Normal gallbladder.There is no biliary ductal dilation. Pancreas: Normal contours without ductal dilatation. No peripancreatic fluid collection. Spleen: No splenic laceration or hematoma. Adrenals/Urinary Tract: --Adrenal glands: No adrenal hemorrhage.  --Right kidney/ureter: No hydronephrosis or perinephric hematoma. --Left kidney/ureter: No hydronephrosis or perinephric hematoma. --Urinary bladder: Unremarkable. Stomach/Bowel: --Stomach/Duodenum: No hiatal hernia or other gastric abnormality. Normal duodenal course and caliber. --Small bowel: No dilatation or inflammation. --Colon: No focal abnormality. --Appendix: Not visualized. No right lower quadrant inflammation or free fluid. Vascular/Lymphatic: Normal course and caliber of the major abdominal vessels. --No retroperitoneal lymphadenopathy. --No mesenteric lymphadenopathy. --No pelvic or inguinal lymphadenopathy. Reproductive: Unremarkable Other: No ascites or free air. The abdominal wall is normal. Musculoskeletal. No acute displaced fractures. IMPRESSION: No CT findings to explain the patient's abdominal pain. Electronically Signed   By: Constance Holster M.D.   On: 10/06/2019 19:47     Scheduled Meds: . enoxaparin (LOVENOX) injection  40 mg Subcutaneous Q24H  . ondansetron (ZOFRAN) IV  4 mg Intravenous Q8H  . pantoprazole (PROTONIX) IV  40 mg Intravenous Daily  . sodium chloride flush  3 mL Intravenous Once   Continuous Infusions: . lactated ringers 125 mL/hr at 10/06/19 2228     LOS: 1 day   Time Spent in minutes   45 minutes  Bruce [name] D.O. on 10/07/2019 at 8:46 AM  Between 7am to 7pm - Please see pager noted on [email or web address]  After 7pm go to [email or web address]  And look for the night coverage person covering for me after hours  [facility] Office  [phone]

## 2019-10-07 NOTE — ED Notes (Signed)
Pt given ice PO

## 2019-10-07 NOTE — ED Notes (Signed)
Report given to Will, RN. All questions answered.

## 2019-10-08 LAB — BASIC METABOLIC PANEL
Anion gap: 9 (ref 5–15)
BUN: 17 mg/dL (ref 6–20)
CO2: 27 mmol/L (ref 22–32)
Calcium: 8.7 mg/dL — ABNORMAL LOW (ref 8.9–10.3)
Chloride: 101 mmol/L (ref 98–111)
Creatinine, Ser: 1.41 mg/dL — ABNORMAL HIGH (ref 0.61–1.24)
GFR calc Af Amer: >60 mL/min (ref >60)
GFR calc non Af Amer: 57 mL/min — ABNORMAL LOW (ref >60)
Glucose, Bld: 101 mg/dL — ABNORMAL HIGH (ref 70–99)
Potassium: 3.3 mmol/L — ABNORMAL LOW (ref 3.5–5.1)
Sodium: 137 mmol/L (ref 135–145)

## 2019-10-08 LAB — MAGNESIUM: Magnesium: 1.8 mg/dL (ref 1.7–2.4)

## 2019-10-08 MED ORDER — POTASSIUM CHLORIDE CRYS ER 20 MEQ PO TBCR
40.0000 meq | EXTENDED_RELEASE_TABLET | Freq: Once | ORAL | Status: AC
  Administered 2019-10-08: 40 meq via ORAL
  Filled 2019-10-08: qty 2

## 2019-10-08 MED ORDER — PANTOPRAZOLE SODIUM 40 MG PO TBEC
40.0000 mg | DELAYED_RELEASE_TABLET | Freq: Every day | ORAL | Status: DC
  Administered 2019-10-08 – 2019-10-09 (×2): 40 mg via ORAL
  Filled 2019-10-08 (×2): qty 1

## 2019-10-08 NOTE — Progress Notes (Signed)
PROGRESS NOTE    Bruce [name]  [id number]:[id number] DOB: 10/29/1965 DOA: 10/06/2019 PCP: Patient, No Pcp Per   Brief Narrative:  HPI On 10/06/2019 by Dr. Lyda Perone Matteus [name] is a 54 y.o. male with medical history significant of HTN. Pt returns to ED with intractable N/V.  Unable to keep down even sips of water. Pt initially seen in ED on 3/12 (4 days ago) with N/V, cough, fatigue, chills, sweats. COVID test at that time was negative, no known exposures to COVID or other sick persons. Did endorse recent marijuana use, but no prior h/o CVS. Sent home on 3/12, symptoms persisted / worsened, back in to ED today.  Interim history Patient admitted with acute kidney injury likely secondary to dehydration along with nausea and vomiting.  Continue IV fluids. Assessment & Plan   Acute kidney injury -Secondary to dehydration from nausea and vomiting vs medications -Question if this is due to cannabinoid hyperemesis syndrome (had long discussion with patient, feels he may have used a new or different type of marijuana) -Creatinine on admission 3.06, currently down to 1.4 (baseline 1.3) -CT abdomen pelvis negative for acute pathology -UA unremarkable -Continue IV fluids -Continue to monitor BMP  Nausea and vomiting -Unknown cause, question of this is due to pain marijuana use -Continue antiemetics as needed, will discontinued scheduled doses -placed on clear liquid diet, will advance to full liquids and continue to advance as tolerated  Essential hypertension -Hold lisinopril/HCTZ -BP was mildly soft on admission, currently stable -Continue to monitor and add medications if needed  Marijuana use -UDS positive for opiates and THC -Discussed cessation  Chest pain -EKG obtained on admission noted sinus tachycardia rate 123 -HS troponin unremarkable  -Chest pain was not reproducible on physical exam -?related to N/V vs GERD, continue protonix  Throat pain -likely from continued  vomiting -Continue Cepacol lozenges  DVT Prophylaxis  lovenox  Code Status: Full  Family Communication: None at bedside  Disposition Plan: Admitted from home for N/V found to have AKI.  Will continue to advance diet as tolerated. Suspect home in 1-2 days.  Consultants None  Procedures  None  Antibiotics   Anti-infectives (From admission, onward)   None      Subjective:   Bruce [name] seen and examined today.  Continues to have some discomfort but was able to keep down liquids yesterday.  States he has had some nausea, without vomiting.  Denies current chest pain or shortness of breath, diarrhea or constipation, dizziness or headache.    Objective:   Vitals:   10/07/19 1115 10/07/19 1309 10/07/19 2053 10/08/19 0502  BP: (!) 150/94 129/87 139/90 (!) 161/92  Pulse:  89 92 76  Resp: 20 19 17 16   Temp:  99.1 F (37.3 C) 98.7 F (37.1 C) 98 F (36.7 C)  TempSrc:  Oral Oral Oral  SpO2: 97% 100% 100% 99%    Intake/Output Summary (Last 24 hours) at 10/08/2019 0912 Last data filed at 10/07/2019 2134 Gross per 24 hour  Intake 2284.57 ml  Output 400 ml  Net 1884.57 ml   There were no vitals filed for this visit.  Exam  General: Well developed, well nourished, NAD, appears stated age  HEENT: NCAT, mucous membranes moist.   Cardiovascular: S1 S2 auscultated, RRR, no murmur  Respiratory: Clear to auscultation bilaterally with equal chest rise  Abdomen: Soft, nontender, nondistended, + bowel sounds  Extremities: warm dry without cyanosis clubbing or edema  Neuro: AAOx3, nonfocal  Psych: Pleasant, appropriate mood and affect  Data Reviewed: I have personally reviewed following labs and imaging studies  CBC: Recent Labs  Lab 10/02/19 1033 10/06/19 1742  WBC 13.2* 14.2*  HGB 16.3 17.6*  HCT 49.1 50.1  MCV 99.2 94.4  PLT 246 246   Basic Metabolic Panel: Recent Labs  Lab 10/02/19 1033 10/06/19 1742 10/07/19 0218 10/08/19 0134  NA 142 135 137 137  K  4.1 3.2* 3.9 3.3*  CL 106 97* 100 101  CO2 23 23 25 27   GLUCOSE 128* 122* 103* 101*  BUN 15 31* 31* 17  CREATININE 1.29* 3.06* 2.15* 1.41*  CALCIUM 10.1 9.5 8.8* 8.7*  MG  --  2.2  --   --    GFR: Estimated Creatinine Clearance: 77.4 mL/min (A) (by C-G formula based on SCr of 1.41 mg/dL (H)). Liver Function Tests: Recent Labs  Lab 10/02/19 1033 10/06/19 1742 10/07/19 0218  AST 32 33 29  ALT 33 29 28  ALKPHOS 86 73 65  BILITOT 1.1 1.4* 1.6*  PROT 7.5 7.7 6.4*  ALBUMIN 4.3 4.2 3.5   Recent Labs  Lab 10/02/19 1033  LIPASE 21   No results for input(s): AMMONIA in the last 168 hours. Coagulation Profile: No results for input(s): INR, PROTIME in the last 168 hours. Cardiac Enzymes: No results for input(s): CKTOTAL, CKMB, CKMBINDEX, TROPONINI in the last 168 hours. BNP (last 3 results) No results for input(s): PROBNP in the last 8760 hours. HbA1C: No results for input(s): HGBA1C in the last 72 hours. CBG: Recent Labs  Lab 10/06/19 1754  GLUCAP 119*   Lipid Profile: No results for input(s): CHOL, HDL, LDLCALC, TRIG, CHOLHDL, LDLDIRECT in the last 72 hours. Thyroid Function Tests: No results for input(s): TSH, T4TOTAL, FREET4, T3FREE, THYROIDAB in the last 72 hours. Anemia Panel: No results for input(s): VITAMINB12, FOLATE, FERRITIN, TIBC, IRON, RETICCTPCT in the last 72 hours. Urine analysis:    Component Value Date/Time   COLORURINE YELLOW 10/06/2019 2250   APPEARANCEUR HAZY (A) 10/06/2019 2250   LABSPEC 1.020 10/06/2019 2250   PHURINE 5.0 10/06/2019 2250   GLUCOSEU NEGATIVE 10/06/2019 2250   HGBUR NEGATIVE 10/06/2019 2250   BILIRUBINUR NEGATIVE 10/06/2019 2250   KETONESUR 20 (A) 10/06/2019 2250   PROTEINUR 30 (A) 10/06/2019 2250   NITRITE NEGATIVE 10/06/2019 2250   LEUKOCYTESUR NEGATIVE 10/06/2019 2250   Sepsis Labs: @LABRCNTIP (procalcitonin:4,lacticidven:4)  ) Recent Results (from the past 240 hour(s))  SARS CORONAVIRUS 2 (TAT 6-24 HRS) Nasopharyngeal  Nasopharyngeal Swab     Status: None   Collection Time: 10/02/19 11:13 AM   Specimen: Nasopharyngeal Swab  Result Value Ref Range Status   SARS Coronavirus 2 NEGATIVE NEGATIVE Final    Comment: (NOTE) SARS-CoV-2 target nucleic acids are NOT DETECTED. The SARS-CoV-2 RNA is generally detectable in upper and lower respiratory specimens during the acute phase of infection. Negative results do not preclude SARS-CoV-2 infection, do not rule out co-infections with other pathogens, and should not be used as the sole basis for treatment or other patient management decisions. Negative results must be combined with clinical observations, patient history, and epidemiological information. The expected result is Negative. Fact Sheet for Patients: Fact Sheet for Healthcare Providers: 12/02/19 This test is not yet approved or cleared by the [employer] FDA and  has been authorized for detection and/or diagnosis of SARS-CoV-2 by FDA under an Emergency Use Authorization (EUA). This EUA will remain  in effect (meaning this test can be used) for the duration of the COVID-19 declaration under Section 56 4(b)(1)  of the Act, 21 U.S.C. section 360bbb-3(b)(1), unless the authorization is terminated or revoked sooner. Performed at [facility], [place] [address] [place], [place] [address]   SARS CORONAVIRUS 2 (TAT 6-24 HRS) Nasopharyngeal Nasopharyngeal Swab     Status: None   Collection Time: 10/07/19  5:43 AM   Specimen: Nasopharyngeal Swab  Result Value Ref Range Status   SARS Coronavirus 2 NEGATIVE NEGATIVE Final    Comment: (NOTE) SARS-CoV-2 target nucleic acids are NOT DETECTED. The SARS-CoV-2 RNA is generally detectable in upper and lower respiratory specimens during the acute phase of infection. Negative results do not preclude SARS-CoV-2 infection, do not rule out co-infections with other pathogens, and should not  be used as the sole basis for treatment or other patient management decisions. Negative results must be combined with clinical observations, patient history, and epidemiological information. The expected result is Negative. Fact Sheet for Patients: Sugar[employer]be Fact Sheet for Healthcare Providers: https://www.woods-mathews.com/ This test is not yet approved or cleared by the [place] FDA and  has been authorized for detection and/or diagnosis of SARS-CoV-2 by FDA under an Emergency Use Authorization (EUA). This EUA will remain  in effect (meaning this test can be used) for the duration of the COVID-19 declaration under Section 56 4(b)(1) of the Act, 21 U.S.C. section 360bbb-3(b)(1), unless the authorization is terminated or revoked sooner. Performed at [facility], [place] [address], [place], [place] [address]       Radiology Studies: CT ABDOMEN PELVIS WO CONTRAST  Result Date: 10/06/2019 CLINICAL DATA:  Abdominal pain. EXAM: CT ABDOMEN AND PELVIS WITHOUT CONTRAST TECHNIQUE: Multidetector CT imaging of the abdomen and pelvis was performed following the standard protocol without IV contrast. COMPARISON:  None. FINDINGS: Lower chest: The lung bases are clear. The heart size is normal. Hepatobiliary: The liver is normal. Normal gallbladder.There is no biliary ductal dilation. Pancreas: Normal contours without ductal dilatation. No peripancreatic fluid collection. Spleen: No splenic laceration or hematoma. Adrenals/Urinary Tract: --Adrenal glands: No adrenal hemorrhage. --Right kidney/ureter: No hydronephrosis or perinephric hematoma. --Left kidney/ureter: No hydronephrosis or perinephric hematoma. --Urinary bladder: Unremarkable. Stomach/Bowel: --Stomach/Duodenum: No hiatal hernia or other gastric abnormality. Normal duodenal course and caliber. --Small bowel: No dilatation or inflammation. --Colon: No focal abnormality. --Appendix: Not  visualized. No right lower quadrant inflammation or free fluid. Vascular/Lymphatic: Normal course and caliber of the major abdominal vessels. --No retroperitoneal lymphadenopathy. --No mesenteric lymphadenopathy. --No pelvic or inguinal lymphadenopathy. Reproductive: Unremarkable Other: No ascites or free air. The abdominal wall is normal. Musculoskeletal. No acute displaced fractures. IMPRESSION: No CT findings to explain the patient's abdominal pain. Electronically Signed   By: Constance Holster M.D.   On: 10/06/2019 19:47     Scheduled Meds: . enoxaparin (LOVENOX) injection  40 mg Subcutaneous Q24H  . pantoprazole  40 mg Oral Daily  . sodium chloride flush  3 mL Intravenous Once   Continuous Infusions: . lactated ringers 125 mL/hr at 10/08/19 0308     LOS: 2 days   Time Spent in minutes   45 minutes  Renny Gunnarson D.O. on 10/08/2019 at 9:12 AM  Between 7am to 7pm - Please see pager noted on [email or web address]  After 7pm go to [email or web address]  And look for the night coverage person covering for me after hours  [facility] Office  [phone]

## 2019-10-08 NOTE — Discharge Summary (Signed)
Physician Discharge Summary  Bruce [name] TKZ:[id number] DOB: 1965-11-09 DOA: 10/06/2019  PCP: Patient, No Pcp Per  Admit date: 10/06/2019 Discharge date: 10/09/2019  Time spent: 45 minutes  Recommendations for Outpatient Follow-up:  Patient will be discharged to home.  Patient will need to follow up with primary care provider within one week of discharge.  Patient should continue medications as prescribed.  Patient should follow a heart healthy diet.   Discharge Diagnoses:  Acute kidney injury Nausea and vomiting Essential hypertension Marijuana use Chest pain Throat pain  Discharge Condition: Stable   Diet recommendation: heart healthy  There were no vitals filed for this visit.  History of present illness:  On 10/06/2019 by Dr. Jennette Kettle Lenvil Walkeris a 54 y.o.malewith medical history significant ofHTN. Pt returns to ED with intractable N/V. Unable to keep down even sips of water. Pt initially seen in ED on 3/12 (4 days ago) with N/V, cough, fatigue, chills, sweats. COVID test at that time was negative, no known exposures to COVID or other sick persons. Did endorse recent marijuana use, but no prior h/o CVS. Sent home on 3/12, symptoms persisted / worsened, back in to ED today.  Hospital Course:  Acute kidney injury -Secondary to dehydration from nausea and vomiting vs medications -Question if this is due to cannabinoid hyperemesis syndrome (had long discussion with patient, feels he may have used a new or different type of marijuana) -Creatinine on admission 3.06, currently down to 1.4 (baseline 1.3) -CT abdomen pelvis negative for acute pathology -UA unremarkable -was placed on IVF -repeat BMP in one week  Nausea and vomiting -Unknown cause, question of this is due to pain marijuana use -Continue antiemetics as needed, discontinued scheduled doses -placed on soft diet and tolerated it well  Essential hypertension -Inially lisinopril/HCTZ held due to  AKI, may restart on disharge  Marijuana use -UDS positive for opiates and THC -Discussed cessation  Chest pain -EKG obtained on admission noted sinus tachycardia rate 123 -Troponin unremarkable; repeat EKG SR 98 -Chest pain was not reproducible on physical exam -?related to N/V vs GERD -Resolved  Throat pain -likely from continued vomiting -Resolved   Procedures: None  Consultations: None  Discharge Exam: Vitals:   10/08/19 2217 10/09/19 0528  BP: 140/88 (!) 140/97  Pulse: 83 80  Resp: 18 16  Temp: 98.2 F (36.8 C) 98.7 F (37.1 C)  SpO2: 96% 98%     General: Well developed, well nourished, NAD, appears stated age  HEENT: NCAT, mucous membranes moist.  Cardiovascular: S1 S2 auscultated, no rubs, murmurs or gallops. Regular rate and rhythm.  Respiratory: Clear to auscultation bilaterally with equal chest rise  Abdomen: Soft, nontender, nondistended, + bowel sounds  Extremities: warm dry without cyanosis clubbing or edema  Neuro: AAOx3, nonfocal  Psych: Appropriate mood and affect, pleasant  Discharge Instructions Discharge Instructions    Discharge instructions   Complete by: As directed    Patient will be discharged to home.  Patient will need to follow up with primary care provider within one week of discharge.  Patient should continue medications as prescribed.  Patient should follow a heart healthy diet.     Allergies as of 10/09/2019      Reactions   Penicillins Anaphylaxis   Did it involve swelling of the face/tongue/throat, SOB, or low BP? No Did it involve sudden or severe rash/hives, skin peeling, or any reaction on the inside of your mouth or nose?Yes Did you need to seek medical attention at a hospital or  doctor's office? Yes When did it last happen?Child  If all above answers are "NO", may proceed with cephalosporin use.      Medication List    STOP taking these medications   benzonatate 100 MG capsule Commonly known as:  TESSALON     TAKE these medications   albuterol 108 (90 Base) MCG/ACT inhaler Commonly known as: VENTOLIN HFA Inhale 2 puffs into the lungs every 4 (four) hours as needed for wheezing or shortness of breath.   cetirizine 10 MG tablet Commonly known as: ZyrTEC Allergy Take 1 tablet (10 mg total) by mouth daily.   lisinopril-hydrochlorothiazide 20-12.5 MG tablet Commonly known as: ZESTORETIC Take 1 tablet by mouth daily.   ondansetron 4 MG tablet Commonly known as: ZOFRAN Take 1 tablet (4 mg total) by mouth every 8 (eight) hours as needed for nausea or vomiting.      Allergies  Allergen Reactions  . Penicillins Anaphylaxis    Did it involve swelling of the face/tongue/throat, SOB, or low BP? No Did it involve sudden or severe rash/hives, skin peeling, or any reaction on the inside of your mouth or nose?Yes Did you need to seek medical attention at a hospital or doctor's office? Yes When did it last happen?Child  If all above answers are "NO", may proceed with cephalosporin use.      The results of significant diagnostics from this hospitalization (including imaging, microbiology, ancillary and laboratory) are listed below for reference.    Significant Diagnostic Studies: CT ABDOMEN PELVIS WO CONTRAST  Result Date: 10/06/2019 CLINICAL DATA:  Abdominal pain. EXAM: CT ABDOMEN AND PELVIS WITHOUT CONTRAST TECHNIQUE: Multidetector CT imaging of the abdomen and pelvis was performed following the standard protocol without IV contrast. COMPARISON:  None. FINDINGS: Lower chest: The lung bases are clear. The heart size is normal. Hepatobiliary: The liver is normal. Normal gallbladder.There is no biliary ductal dilation. Pancreas: Normal contours without ductal dilatation. No peripancreatic fluid collection. Spleen: No splenic laceration or hematoma. Adrenals/Urinary Tract: --Adrenal glands: No adrenal hemorrhage. --Right kidney/ureter: No hydronephrosis or perinephric hematoma. --Left  kidney/ureter: No hydronephrosis or perinephric hematoma. --Urinary bladder: Unremarkable. Stomach/Bowel: --Stomach/Duodenum: No hiatal hernia or other gastric abnormality. Normal duodenal course and caliber. --Small bowel: No dilatation or inflammation. --Colon: No focal abnormality. --Appendix: Not visualized. No right lower quadrant inflammation or free fluid. Vascular/Lymphatic: Normal course and caliber of the major abdominal vessels. --No retroperitoneal lymphadenopathy. --No mesenteric lymphadenopathy. --No pelvic or inguinal lymphadenopathy. Reproductive: Unremarkable Other: No ascites or free air. The abdominal wall is normal. Musculoskeletal. No acute displaced fractures. IMPRESSION: No CT findings to explain the patient's abdominal pain. Electronically Signed   By: Katherine Mantle M.D.   On: 10/06/2019 19:47   DG Chest Portable 1 View  Result Date: 10/02/2019 CLINICAL DATA:  54 year old male with history of cough. Cold-like symptoms. EXAM: PORTABLE CHEST 1 VIEW COMPARISON:  Chest x-ray 04/13/2011. FINDINGS: Lung volumes are normal. No consolidative airspace disease. No pleural effusions. No pneumothorax. No pulmonary nodule or mass noted. Pulmonary vasculature and the cardiomediastinal silhouette are within normal limits. IMPRESSION: No radiographic evidence of acute cardiopulmonary disease. Electronically Signed   By: Trudie Reed M.D.   On: 10/02/2019 11:58    Microbiology: Recent Results (from the past 240 hour(s))  SARS CORONAVIRUS 2 (TAT 6-24 HRS) Nasopharyngeal Nasopharyngeal Swab     Status: None   Collection Time: 10/02/19 11:13 AM   Specimen: Nasopharyngeal Swab  Result Value Ref Range Status   SARS Coronavirus 2 NEGATIVE NEGATIVE Final  Comment: (NOTE) SARS-CoV-2 target nucleic acids are NOT DETECTED. The SARS-CoV-2 RNA is generally detectable in upper and lower respiratory specimens during the acute phase of infection. Negative results do not preclude SARS-CoV-2  infection, do not rule out co-infections with other pathogens, and should not be used as the sole basis for treatment or other patient management decisions. Negative results must be combined with clinical observations, patient history, and epidemiological information. The expected result is Negative. Fact Sheet for Patients: [employer] Fact Sheet for Healthcare Providers: quierodirigir.com This test is not yet approved or cleared by the [place] FDA and  has been authorized for detection and/or diagnosis of SARS-CoV-2 by FDA under an Emergency Use Authorization (EUA). This EUA will remain  in effect (meaning this test can be used) for the duration of the COVID-19 declaration under Section 56 4(b)(1) of the Act, 21 U.S.C. section 360bbb-3(b)(1), unless the authorization is terminated or revoked sooner. Performed at [facility], [address] [place], [place] [address]   SARS CORONAVIRUS 2 (TAT 6-24 HRS) Nasopharyngeal Nasopharyngeal Swab     Status: None   Collection Time: 10/07/19  5:43 AM   Specimen: Nasopharyngeal Swab  Result Value Ref Range Status   SARS Coronavirus 2 NEGATIVE NEGATIVE Final    Comment: (NOTE) SARS-CoV-2 target nucleic acids are NOT DETECTED. The SARS-CoV-2 RNA is generally detectable in upper and lower respiratory specimens during the acute phase of infection. Negative results do not preclude SARS-CoV-2 infection, do not rule out co-infections with other pathogens, and should not be used as the sole basis for treatment or other patient management decisions. Negative results must be combined with clinical observations, patient history, and epidemiological information. The expected result is Negative. Fact Sheet for Patients: [employer] Fact Sheet for Healthcare Providers: quierodirigir.com This test is not yet approved or cleared by  the [place] FDA and  has been authorized for detection and/or diagnosis of SARS-CoV-2 by FDA under an Emergency Use Authorization (EUA). This EUA will remain  in effect (meaning this test can be used) for the duration of the COVID-19 declaration under Section 56 4(b)(1) of the Act, 21 U.S.C. section 360bbb-3(b)(1), unless the authorization is terminated or revoked sooner. Performed at [facility], [address] [place], [place] [address]      Labs: Basic Metabolic Panel: Recent Labs  Lab 10/02/19 1033 10/06/19 1742 10/07/19 0218 10/08/19 0134 10/09/19 0049  NA 142 135 137 137 139  K 4.1 3.2* 3.9 3.3* 3.9  CL 106 97* 100 101 104  CO2 23 23 25 27 27   GLUCOSE 128* 122* 103* 101* 109*  BUN 15 31* 31* 17 13  CREATININE 1.29* 3.06* 2.15* 1.41* 1.28*  CALCIUM 10.1 9.5 8.8* 8.7* 8.4*  MG  --  2.2  --  1.8  --    Liver Function Tests: Recent Labs  Lab 10/02/19 1033 10/06/19 1742 10/07/19 0218  AST 32 33 29  ALT 33 29 28  ALKPHOS 86 73 65  BILITOT 1.1 1.4* 1.6*  PROT 7.5 7.7 6.4*  ALBUMIN 4.3 4.2 3.5   Recent Labs  Lab 10/02/19 1033  LIPASE 21   No results for input(s): AMMONIA in the last 168 hours. CBC: Recent Labs  Lab 10/02/19 1033 10/06/19 1742  WBC 13.2* 14.2*  HGB 16.3 17.6*  HCT 49.1 50.1  MCV 99.2 94.4  PLT 246 246   Cardiac Enzymes: No results for input(s): CKTOTAL, CKMB, CKMBINDEX, TROPONINI in the last 168 hours. BNP: BNP (last  3 results) No results for input(s): BNP in the last 8760 hours.  ProBNP (last 3 results) No results for input(s): PROBNP in the last 8760 hours.  CBG: Recent Labs  Lab 10/06/19 1754  GLUCAP 119*       Signed:  Deliliah Spranger  [facility] 10/09/2019, 8:13 AM

## 2019-10-09 LAB — BASIC METABOLIC PANEL
Anion gap: 8 (ref 5–15)
BUN: 13 mg/dL (ref 6–20)
CO2: 27 mmol/L (ref 22–32)
Calcium: 8.4 mg/dL — ABNORMAL LOW (ref 8.9–10.3)
Chloride: 104 mmol/L (ref 98–111)
Creatinine, Ser: 1.28 mg/dL — ABNORMAL HIGH (ref 0.61–1.24)
GFR calc Af Amer: >60 mL/min (ref >60)
GFR calc non Af Amer: >60 mL/min (ref >60)
Glucose, Bld: 109 mg/dL — ABNORMAL HIGH (ref 70–99)
Potassium: 3.9 mmol/L (ref 3.5–5.1)
Sodium: 139 mmol/L (ref 135–145)

## 2019-10-09 MED ORDER — LISINOPRIL 20 MG PO TABS
20.0000 mg | ORAL_TABLET | Freq: Every day | ORAL | Status: DC
  Administered 2019-10-09: 20 mg via ORAL
  Filled 2019-10-09: qty 1

## 2019-10-09 MED ORDER — LISINOPRIL-HYDROCHLOROTHIAZIDE 20-12.5 MG PO TABS: 1.0000 | ORAL_TABLET | Freq: Every day | ORAL | Status: DC

## 2019-10-09 MED ORDER — HYDROCHLOROTHIAZIDE 12.5 MG PO CAPS
12.5000 mg | ORAL_CAPSULE | Freq: Every day | ORAL | Status: DC
  Administered 2019-10-09: 12.5 mg via ORAL
  Filled 2019-10-09: qty 1

## 2019-10-09 NOTE — Discharge Instructions (Signed)

## 2020-02-24 ENCOUNTER — Encounter (HOSPITAL_COMMUNITY): Payer: Self-pay | Admitting: Emergency Medicine

## 2020-02-24 ENCOUNTER — Emergency Department (HOSPITAL_COMMUNITY)
Admission: EM | Admit: 2020-02-24 | Discharge: 2020-02-24 | Disposition: A | Discharge status: Home / Self Care | Payer: BLUE CROSS/BLUE SHIELD | Attending: Emergency Medicine | Admitting: Emergency Medicine

## 2020-02-24 ENCOUNTER — Emergency Department (HOSPITAL_COMMUNITY): Payer: BLUE CROSS/BLUE SHIELD

## 2020-02-24 ENCOUNTER — Other Ambulatory Visit: Payer: Self-pay

## 2020-02-24 DIAGNOSIS — R1030 Lower abdominal pain, unspecified: Secondary | ICD-10-CM | POA: Diagnosis not present

## 2020-02-24 DIAGNOSIS — R112 Nausea with vomiting, unspecified: Secondary | ICD-10-CM | POA: Insufficient documentation

## 2020-02-24 DIAGNOSIS — Z79899 Other long term (current) drug therapy: Secondary | ICD-10-CM | POA: Diagnosis not present

## 2020-02-24 DIAGNOSIS — F17210 Nicotine dependence, cigarettes, uncomplicated: Secondary | ICD-10-CM | POA: Insufficient documentation

## 2020-02-24 DIAGNOSIS — I10 Essential (primary) hypertension: Secondary | ICD-10-CM

## 2020-02-24 DIAGNOSIS — F1210 Cannabis abuse, uncomplicated: Secondary | ICD-10-CM | POA: Insufficient documentation

## 2020-02-24 LAB — CBC
HCT: 51.9 % (ref 39.0–52.0)
Hemoglobin: 17.6 g/dL — ABNORMAL HIGH (ref 13.0–17.0)
MCH: 33.5 pg (ref 26.0–34.0)
MCHC: 33.9 g/dL (ref 30.0–36.0)
MCV: 98.9 fL (ref 80.0–100.0)
Platelets: 262 10*3/uL (ref 150–400)
RBC: 5.25 MIL/uL (ref 4.22–5.81)
RDW: 13.6 % (ref 11.5–15.5)
WBC: 14.2 10*3/uL — ABNORMAL HIGH (ref 4.0–10.5)
nRBC: 0 % (ref 0.0–0.2)

## 2020-02-24 LAB — COMPREHENSIVE METABOLIC PANEL
ALT: 20 U/L (ref 0–44)
AST: 23 U/L (ref 15–41)
Albumin: 4.6 g/dL (ref 3.5–5.0)
Alkaline Phosphatase: 79 U/L (ref 38–126)
Anion gap: 12 (ref 5–15)
BUN: 16 mg/dL (ref 6–20)
CO2: 26 mmol/L (ref 22–32)
Calcium: 9.7 mg/dL (ref 8.9–10.3)
Chloride: 103 mmol/L (ref 98–111)
Creatinine, Ser: 1.13 mg/dL (ref 0.61–1.24)
GFR calc Af Amer: >60 mL/min (ref >60)
GFR calc non Af Amer: >60 mL/min (ref >60)
Glucose, Bld: 132 mg/dL — ABNORMAL HIGH (ref 70–99)
Potassium: 3.7 mmol/L (ref 3.5–5.1)
Sodium: 141 mmol/L (ref 135–145)
Total Bilirubin: 0.8 mg/dL (ref 0.3–1.2)
Total Protein: 8.2 g/dL — ABNORMAL HIGH (ref 6.5–8.1)

## 2020-02-24 LAB — URINALYSIS, ROUTINE W REFLEX MICROSCOPIC
Bacteria, UA: NONE SEEN
Bilirubin Urine: NEGATIVE
Glucose, UA: NEGATIVE mg/dL
Ketones, ur: 20 mg/dL — AB
Leukocytes,Ua: NEGATIVE
Nitrite: NEGATIVE
Protein, ur: 100 mg/dL — AB
Specific Gravity, Urine: 1.03 (ref 1.005–1.030)
pH: 5 (ref 5.0–8.0)

## 2020-02-24 LAB — LIPASE, BLOOD: Lipase: 105 U/L — ABNORMAL HIGH (ref 11–51)

## 2020-02-24 MED ORDER — ONDANSETRON HCL 4 MG PO TABS: 4.0000 mg | ORAL_TABLET | Freq: Three times a day (TID) | ORAL | 0 refills | Status: DC | PRN

## 2020-02-24 MED ORDER — SODIUM CHLORIDE 0.9 % IV BOLUS
1000.0000 mL | Freq: Once | INTRAVENOUS | Status: AC
  Administered 2020-02-24: 1000 mL via INTRAVENOUS

## 2020-02-24 MED ORDER — ONDANSETRON HCL 4 MG/2ML IJ SOLN
4.0000 mg | Freq: Once | INTRAMUSCULAR | Status: AC
  Administered 2020-02-24: 4 mg via INTRAVENOUS
  Filled 2020-02-24: qty 2

## 2020-02-24 MED ORDER — ONDANSETRON 4 MG PO TBDP
4.0000 mg | ORAL_TABLET | Freq: Once | ORAL | Status: DC | PRN
  Filled 2020-02-24: qty 1

## 2020-02-24 MED ORDER — SODIUM CHLORIDE 0.9% FLUSH: 3.0000 mL | Freq: Once | INTRAVENOUS | Status: DC

## 2020-02-24 MED ORDER — CLONIDINE HCL 0.1 MG PO TABS
0.1000 mg | ORAL_TABLET | Freq: Once | ORAL | Status: AC
  Administered 2020-02-24: 0.1 mg via ORAL
  Filled 2020-02-24: qty 1

## 2020-02-24 MED ORDER — IOHEXOL 300 MG/ML  SOLN
100.0000 mL | Freq: Once | INTRAMUSCULAR | Status: AC | PRN
  Administered 2020-02-24: 100 mL via INTRAVENOUS

## 2020-02-24 MED ORDER — PANTOPRAZOLE SODIUM 40 MG IV SOLR
40.0000 mg | Freq: Once | INTRAVENOUS | Status: AC
  Administered 2020-02-24: 40 mg via INTRAVENOUS
  Filled 2020-02-24: qty 40

## 2020-02-24 MED ORDER — LISINOPRIL-HYDROCHLOROTHIAZIDE 20-12.5 MG PO TABS: 1.0000 | ORAL_TABLET | Freq: Every day | ORAL | 0 refills | Status: DC

## 2020-02-24 NOTE — ED Notes (Signed)
Pt in imaging unable to give meds at this time

## 2020-02-24 NOTE — ED Triage Notes (Addendum)
Patient comes in complaining of thowing up and can not keep anything down the last two days per patient. Patient has not had his blood pressure medications for 4 days. Patient needs a refill.

## 2020-02-24 NOTE — Discharge Instructions (Addendum)
Follow-up with a primary care doctor as soon as possible.  Return here as needed for any worsening symptoms.
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
## 2020-11-01 ENCOUNTER — Emergency Department (HOSPITAL_COMMUNITY): Payer: 59

## 2020-11-01 ENCOUNTER — Observation Stay (HOSPITAL_COMMUNITY): Payer: 59

## 2020-11-01 ENCOUNTER — Observation Stay (HOSPITAL_COMMUNITY)
Admission: EM | Admit: 2020-11-01 | Discharge: 2020-11-02 | Disposition: A | Discharge status: Home / Self Care | Payer: 59 | Attending: Family Medicine | Admitting: Family Medicine

## 2020-11-01 ENCOUNTER — Encounter (HOSPITAL_COMMUNITY): Payer: Self-pay

## 2020-11-01 ENCOUNTER — Other Ambulatory Visit: Payer: Self-pay

## 2020-11-01 DIAGNOSIS — R911 Solitary pulmonary nodule: Secondary | ICD-10-CM | POA: Diagnosis not present

## 2020-11-01 DIAGNOSIS — Z79899 Other long term (current) drug therapy: Secondary | ICD-10-CM | POA: Diagnosis not present

## 2020-11-01 DIAGNOSIS — F17210 Nicotine dependence, cigarettes, uncomplicated: Secondary | ICD-10-CM | POA: Insufficient documentation

## 2020-11-01 DIAGNOSIS — J45909 Unspecified asthma, uncomplicated: Secondary | ICD-10-CM | POA: Insufficient documentation

## 2020-11-01 DIAGNOSIS — R112 Nausea with vomiting, unspecified: Secondary | ICD-10-CM | POA: Diagnosis not present

## 2020-11-01 DIAGNOSIS — M25511 Pain in right shoulder: Secondary | ICD-10-CM | POA: Diagnosis not present

## 2020-11-01 DIAGNOSIS — I10 Essential (primary) hypertension: Secondary | ICD-10-CM | POA: Insufficient documentation

## 2020-11-01 DIAGNOSIS — Z20822 Contact with and (suspected) exposure to covid-19: Secondary | ICD-10-CM | POA: Diagnosis not present

## 2020-11-01 DIAGNOSIS — R0789 Other chest pain: Secondary | ICD-10-CM | POA: Diagnosis present

## 2020-11-01 DIAGNOSIS — I160 Hypertensive urgency: Secondary | ICD-10-CM | POA: Diagnosis not present

## 2020-11-01 LAB — URINALYSIS, ROUTINE W REFLEX MICROSCOPIC
Bacteria, UA: NONE SEEN
Bilirubin Urine: NEGATIVE
Glucose, UA: NEGATIVE mg/dL
Ketones, ur: 20 mg/dL — AB
Leukocytes,Ua: NEGATIVE
Nitrite: NEGATIVE
Protein, ur: NEGATIVE mg/dL
Specific Gravity, Urine: 1.029 (ref 1.005–1.030)
pH: 6 (ref 5.0–8.0)

## 2020-11-01 LAB — CBC WITH DIFFERENTIAL/PLATELET
Abs Immature Granulocytes: 0.04 10*3/uL (ref 0.00–0.07)
Basophils Absolute: 0 10*3/uL (ref 0.0–0.1)
Basophils Relative: 0 %
Eosinophils Absolute: 0 10*3/uL (ref 0.0–0.5)
Eosinophils Relative: 0 %
HCT: 48.4 % (ref 39.0–52.0)
Hemoglobin: 17 g/dL (ref 13.0–17.0)
Immature Granulocytes: 0 %
Lymphocytes Relative: 22 %
Lymphs Abs: 2.7 10*3/uL (ref 0.7–4.0)
MCH: 33.9 pg (ref 26.0–34.0)
MCHC: 35.1 g/dL (ref 30.0–36.0)
MCV: 96.6 fL (ref 80.0–100.0)
Monocytes Absolute: 1 10*3/uL (ref 0.1–1.0)
Monocytes Relative: 8 %
Neutro Abs: 8.4 10*3/uL — ABNORMAL HIGH (ref 1.7–7.7)
Neutrophils Relative %: 70 %
Platelets: 231 10*3/uL (ref 150–400)
RBC: 5.01 MIL/uL (ref 4.22–5.81)
RDW: 13.2 % (ref 11.5–15.5)
WBC: 12.2 10*3/uL — ABNORMAL HIGH (ref 4.0–10.5)
nRBC: 0 % (ref 0.0–0.2)

## 2020-11-01 LAB — COMPREHENSIVE METABOLIC PANEL
ALT: 18 U/L (ref 0–44)
AST: 30 U/L (ref 15–41)
Albumin: 4 g/dL (ref 3.5–5.0)
Alkaline Phosphatase: 69 U/L (ref 38–126)
Anion gap: 11 (ref 5–15)
BUN: 14 mg/dL (ref 6–20)
CO2: 25 mmol/L (ref 22–32)
Calcium: 9.5 mg/dL (ref 8.9–10.3)
Chloride: 102 mmol/L (ref 98–111)
Creatinine, Ser: 1.33 mg/dL — ABNORMAL HIGH (ref 0.61–1.24)
GFR, Estimated: >60 mL/min (ref >60)
Glucose, Bld: 129 mg/dL — ABNORMAL HIGH (ref 70–99)
Potassium: 4.1 mmol/L (ref 3.5–5.1)
Sodium: 138 mmol/L (ref 135–145)
Total Bilirubin: 1.4 mg/dL — ABNORMAL HIGH (ref 0.3–1.2)
Total Protein: 7.2 g/dL (ref 6.5–8.1)

## 2020-11-01 LAB — CK: Total CK: 393 U/L (ref 49–397)

## 2020-11-01 LAB — RAPID URINE DRUG SCREEN, HOSP PERFORMED
Amphetamines: NOT DETECTED
Barbiturates: NOT DETECTED
Benzodiazepines: NOT DETECTED
Cocaine: NOT DETECTED
Opiates: NOT DETECTED
Tetrahydrocannabinol: POSITIVE — AB

## 2020-11-01 LAB — RESP PANEL BY RT-PCR (FLU A&B, COVID) ARPGX2
Influenza A by PCR: NEGATIVE
Influenza B by PCR: NEGATIVE
SARS Coronavirus 2 by RT PCR: NEGATIVE

## 2020-11-01 LAB — TROPONIN I (HIGH SENSITIVITY)
Troponin I (High Sensitivity): 19 ng/L — ABNORMAL HIGH (ref <18)
Troponin I (High Sensitivity): 19 ng/L — ABNORMAL HIGH (ref <18)

## 2020-11-01 LAB — LIPASE, BLOOD: Lipase: 25 U/L (ref 11–51)

## 2020-11-01 MED ORDER — LACTATED RINGERS IV SOLN: INTRAVENOUS | Status: DC

## 2020-11-01 MED ORDER — HYDROCHLOROTHIAZIDE 25 MG PO TABS
12.5000 mg | ORAL_TABLET | Freq: Once | ORAL | Status: AC
  Administered 2020-11-01: 12.5 mg via ORAL
  Filled 2020-11-01: qty 1

## 2020-11-01 MED ORDER — ONDANSETRON HCL 4 MG/2ML IJ SOLN
4.0000 mg | Freq: Four times a day (QID) | INTRAMUSCULAR | Status: DC | PRN
  Administered 2020-11-01 – 2020-11-02 (×3): 4 mg via INTRAVENOUS
  Filled 2020-11-01 (×4): qty 2

## 2020-11-01 MED ORDER — LISINOPRIL 20 MG PO TABS: 20.0000 mg | ORAL_TABLET | Freq: Once | ORAL | Status: DC

## 2020-11-01 MED ORDER — ASPIRIN 81 MG PO CHEW
324.0000 mg | CHEWABLE_TABLET | Freq: Once | ORAL | Status: DC
  Filled 2020-11-01: qty 4

## 2020-11-01 MED ORDER — LORATADINE 10 MG PO TABS
10.0000 mg | ORAL_TABLET | Freq: Every day | ORAL | Status: DC
  Administered 2020-11-02: 10 mg via ORAL
  Filled 2020-11-01 (×2): qty 1

## 2020-11-01 MED ORDER — LABETALOL HCL 200 MG PO TABS
200.0000 mg | ORAL_TABLET | Freq: Two times a day (BID) | ORAL | Status: DC
  Administered 2020-11-01 – 2020-11-02 (×2): 200 mg via ORAL
  Filled 2020-11-01 (×2): qty 1

## 2020-11-01 MED ORDER — ALBUTEROL SULFATE (2.5 MG/3ML) 0.083% IN NEBU: 2.5000 mg | INHALATION_SOLUTION | RESPIRATORY_TRACT | Status: DC | PRN

## 2020-11-01 MED ORDER — IOHEXOL 300 MG/ML  SOLN
75.0000 mL | Freq: Once | INTRAMUSCULAR | Status: AC | PRN
  Administered 2020-11-01: 75 mL via INTRAVENOUS

## 2020-11-01 MED ORDER — ACETAMINOPHEN 325 MG PO TABS: 650.0000 mg | ORAL_TABLET | Freq: Four times a day (QID) | ORAL | Status: DC | PRN

## 2020-11-01 MED ORDER — ONDANSETRON HCL 4 MG/2ML IJ SOLN
4.0000 mg | Freq: Once | INTRAMUSCULAR | Status: AC
  Administered 2020-11-01: 4 mg via INTRAVENOUS
  Filled 2020-11-01: qty 2

## 2020-11-01 MED ORDER — HYDRALAZINE HCL 20 MG/ML IJ SOLN
10.0000 mg | Freq: Once | INTRAMUSCULAR | Status: AC
  Administered 2020-11-01: 10 mg via INTRAVENOUS
  Filled 2020-11-01: qty 1

## 2020-11-01 MED ORDER — ONDANSETRON HCL 4 MG PO TABS: 4.0000 mg | ORAL_TABLET | Freq: Four times a day (QID) | ORAL | Status: DC | PRN

## 2020-11-01 MED ORDER — ENOXAPARIN SODIUM 40 MG/0.4ML ~~LOC~~ SOLN
40.0000 mg | SUBCUTANEOUS | Status: DC
  Administered 2020-11-01: 40 mg via SUBCUTANEOUS
  Filled 2020-11-01: qty 0.4

## 2020-11-01 MED ORDER — AMLODIPINE BESYLATE 5 MG PO TABS
5.0000 mg | ORAL_TABLET | Freq: Once | ORAL | Status: AC
  Administered 2020-11-01: 5 mg via ORAL
  Filled 2020-11-01: qty 1

## 2020-11-01 MED ORDER — LACTATED RINGERS IV BOLUS
1000.0000 mL | Freq: Once | INTRAVENOUS | Status: AC
  Administered 2020-11-01: 1000 mL via INTRAVENOUS

## 2020-11-01 MED ORDER — LABETALOL HCL 5 MG/ML IV SOLN
10.0000 mg | Freq: Once | INTRAVENOUS | Status: AC
  Administered 2020-11-01: 10 mg via INTRAVENOUS
  Filled 2020-11-01: qty 4

## 2020-11-01 MED ORDER — SODIUM CHLORIDE 0.9 % IV BOLUS
1000.0000 mL | Freq: Once | INTRAVENOUS | Status: AC
  Administered 2020-11-01: 1000 mL via INTRAVENOUS

## 2020-11-01 MED ORDER — ACETAMINOPHEN 650 MG RE SUPP: 650.0000 mg | Freq: Four times a day (QID) | RECTAL | Status: DC | PRN

## 2020-11-01 MED ORDER — NICARDIPINE HCL 20 MG PO CAPS: 20.0000 mg | ORAL_CAPSULE | Freq: Two times a day (BID) | ORAL | Status: DC

## 2020-11-01 NOTE — Progress Notes (Signed)
Spoke with night time radiologist Dr. Londell Moh regarding the ordered STAT CTA chest dissection study. Given that this patient already received contrast for the CTA chest PE study earlier this afternoon, he would normally need 24 hours before being re-dosed with contrast for another study. Dr. Londell Moh reviewed the CTA PE study and relayed that while the CTA PE study isn't a perfect study for dissections, he was comfortable reading the study as negative for dissections.   Have discussed this with attending. Dr. Miquel Dunn. Will cancel previously ordered CTA chest dissection study.   Fayette Pho, MD

## 2020-11-01 NOTE — ED Notes (Signed)
I spoke to CT and explained to please get urine sample if pt voids

## 2020-11-01 NOTE — ED Notes (Signed)
Patient transported to CT 

## 2020-11-01 NOTE — ED Triage Notes (Signed)
CP since Saturday  - off an on, sharp pains that radiates between shoulder blades, currently sweating, states has been sweating for the past few days, denies any left arm or shoulder pain, denies emesis with the cp though had emesis a few days ago

## 2020-11-01 NOTE — ED Notes (Signed)
Report called to floor nurse

## 2020-11-01 NOTE — ED Provider Notes (Signed)
  Physical Exam  BP (!) 191/106   Pulse 74   Temp (!) 100.4 F (38 C) (Oral)   Resp 14   Ht 6' (1.829 m)   Wt 95.3 kg   SpO2 96%   BMI 28.48 kg/m   Physical Exam Vitals and nursing note reviewed.  Constitutional:      General: He is not in acute distress.    Appearance: He is well-developed.  HENT:     Head: Normocephalic and atraumatic.  Pulmonary:     Effort: Pulmonary effort is normal.  Abdominal:     General: There is no distension.  Musculoskeletal:        General: Normal range of motion.     Cervical back: Normal range of motion.  Skin:    General: Skin is warm.     Findings: No rash.  Neurological:     Mental Status: He is alert and oriented to person, place, and time.     ED Course/Procedures     Procedures  MDM   Pt signed out to me by S Petrucelli, PA-C. Please see previous notes for further history.   In brief, pt presenting for evaluation of 4 days of intermittent cp with radiation to the shoulder and associated n/v. In the ED, pt found to be very hypertensive. H/o HTN, but has been out of meds for ~1 wk (supposed to be on hctz and lisinopril). He also presented with low grade temp of 100.4 and mildly tachycardic. CTA negative for PE. Labs overall reassuring. After several rounds of bp meds, BP remains elevated. As such, will plan for admission.   Discussed with family medicine, pt to be admitted.        Alveria Apley, PA-C 11/01/20 1548    Tilden Fossa, MD 11/01/20 [phone]

## 2020-11-01 NOTE — ED Notes (Addendum)
When asked what bothers him the most he states that the nausea bothers him the most and then his right shoulder - states it feels like a pinched nerve, states he didn't really come here for chest pain

## 2020-11-01 NOTE — ED Notes (Signed)
I tried to call report.  Spoke with Sue Lush.  Floor RN to call me back

## 2020-11-01 NOTE — Hospital Course (Addendum)
Bruce [name] is a 55 y.o. male with a history of HTN, asthma, allergic rhinitis who presented with intermittent chest pain found to have severe range HTN. Hospital course outlined by problem below:  Severe HTN On presentation to the ED, patient was mildly febrile to 100.4 F and hypertensive up to 204/117 initially.  He had some borderline ST changes on EKG with minimal ST depression in the inferior leads and minimal ST elevation in anterior leads and aVL, though not significantly changed from previous EKG.  EKG was reviewed by cardiologist who did not feel he had a STEMI.  Troponins obtained were flat at 19 and 19.  He had a CXR which was unremarkable and a CTA of the chest which was negative for PE and low likelihood of aortic dissection.  Initial creatinine of 1.33 which is close to his baseline.  He was given multiple antihypertensives in the ED, including labetalol, amlodipine, hydralazine, and HCTZ.  Given no signs of endorgan damage, he was treated with oral medications, started on oral labetalol 200 mg twice daily and he was restarted on his home lisinopril 20 mg and HCTZ 25 mg dose the following morning (as he received an IV contrast study the day of admission). He had no chest pain during admission. Prior to discharge, his BP had improved to 160/114 and he was discharged on the antihypertensive regimen including: lisinopril-hydrochlorothiazide 20-25 mg daily.  Nausea/vomiting Thought to be related to [facility] use.  Initial low-grade fever resolved on next recheck and he remained afebrile throughout the remainder of admission.  He was treated supportively with IV fluids given poor oral intake and Zofran as needed .  Prior to discharge, nausea and vomiting had significant improvement he was able to tolerate food and medications.  Allergic rhinitis He has had significant postnasal drip with cough prior to admission.  Started on loratadine 10 mg daily during admission.   Other problems chronic  and stable.  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 Patient does have smoking history so consider repeat imaging. HbA1c 5.9% in pre-diabetes range. Mildly abnormal lipid panel with total cholesterol 213, LDL 142. Consider starting statin based on ASCVD risk. Please evaluate Blood pressure, if remains elevated, patient may need additional antihypertensive medication.

## 2020-11-01 NOTE — ED Notes (Signed)
Pt aware to notify staff when he voids
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
## 2020-11-02 DIAGNOSIS — R911 Solitary pulmonary nodule: Secondary | ICD-10-CM | POA: Diagnosis not present

## 2020-11-02 DIAGNOSIS — I160 Hypertensive urgency: Secondary | ICD-10-CM

## 2020-11-02 DIAGNOSIS — I10 Essential (primary) hypertension: Secondary | ICD-10-CM | POA: Diagnosis not present

## 2020-11-02 LAB — HEMOGLOBIN A1C
Hgb A1c MFr Bld: 5.9 % — ABNORMAL HIGH (ref 4.8–5.6)
Mean Plasma Glucose: 122.63 mg/dL

## 2020-11-02 LAB — CBC
HCT: 47.2 % (ref 39.0–52.0)
Hemoglobin: 16.5 g/dL (ref 13.0–17.0)
MCH: 34.4 pg — ABNORMAL HIGH (ref 26.0–34.0)
MCHC: 35 g/dL (ref 30.0–36.0)
MCV: 98.3 fL (ref 80.0–100.0)
Platelets: 226 10*3/uL (ref 150–400)
RBC: 4.8 MIL/uL (ref 4.22–5.81)
RDW: 13.2 % (ref 11.5–15.5)
WBC: 11.4 10*3/uL — ABNORMAL HIGH (ref 4.0–10.5)
nRBC: 0 % (ref 0.0–0.2)

## 2020-11-02 LAB — BASIC METABOLIC PANEL
Anion gap: 11 (ref 5–15)
BUN: 10 mg/dL (ref 6–20)
CO2: 28 mmol/L (ref 22–32)
Calcium: 9.3 mg/dL (ref 8.9–10.3)
Chloride: 100 mmol/L (ref 98–111)
Creatinine, Ser: 1.11 mg/dL (ref 0.61–1.24)
GFR, Estimated: >60 mL/min (ref >60)
Glucose, Bld: 88 mg/dL (ref 70–99)
Potassium: 3.2 mmol/L — ABNORMAL LOW (ref 3.5–5.1)
Sodium: 139 mmol/L (ref 135–145)

## 2020-11-02 LAB — HIV ANTIBODY (ROUTINE TESTING W REFLEX): HIV Screen 4th Generation wRfx: NONREACTIVE

## 2020-11-02 LAB — LIPID PANEL
Cholesterol: 213 mg/dL — ABNORMAL HIGH (ref 0–200)
HDL: 48 mg/dL (ref >40)
LDL Cholesterol: 142 mg/dL — ABNORMAL HIGH (ref 0–99)
Total CHOL/HDL Ratio: 4.4 RATIO
Triglycerides: 117 mg/dL (ref <150)
VLDL: 23 mg/dL (ref 0–40)

## 2020-11-02 LAB — TSH: TSH: 0.481 u[IU]/mL (ref 0.350–4.500)

## 2020-11-02 MED ORDER — POTASSIUM CHLORIDE 20 MEQ PO PACK: 40.0000 meq | PACK | Freq: Once | ORAL | Status: DC

## 2020-11-02 MED ORDER — LISINOPRIL-HYDROCHLOROTHIAZIDE 20-25 MG PO TABS: 1.0000 | ORAL_TABLET | Freq: Every day | ORAL | Status: DC

## 2020-11-02 MED ORDER — LISINOPRIL 20 MG PO TABS
20.0000 mg | ORAL_TABLET | Freq: Every day | ORAL | Status: DC
  Administered 2020-11-02: 20 mg via ORAL
  Filled 2020-11-02: qty 1

## 2020-11-02 MED ORDER — POTASSIUM CHLORIDE CRYS ER 20 MEQ PO TBCR
40.0000 meq | EXTENDED_RELEASE_TABLET | Freq: Once | ORAL | Status: AC
  Administered 2020-11-02: 40 meq via ORAL
  Filled 2020-11-02: qty 2

## 2020-11-02 MED ORDER — HYDROCHLOROTHIAZIDE 25 MG PO TABS
25.0000 mg | ORAL_TABLET | Freq: Every day | ORAL | Status: DC
  Administered 2020-11-02: 25 mg via ORAL
  Filled 2020-11-02: qty 1

## 2020-11-02 MED ORDER — LISINOPRIL-HYDROCHLOROTHIAZIDE 20-25 MG PO TABS: 1.0000 | ORAL_TABLET | Freq: Every day | ORAL | 0 refills | Status: DC

## 2020-11-02 MED ORDER — ONDANSETRON HCL 4 MG PO TABS: 4.0000 mg | ORAL_TABLET | Freq: Three times a day (TID) | ORAL | 0 refills | Status: DC | PRN

## 2020-11-02 MED ORDER — LORATADINE 10 MG PO TABS: 10.0000 mg | ORAL_TABLET | Freq: Every day | ORAL | 0 refills | Status: DC

## 2020-11-02 NOTE — Progress Notes (Signed)
Family Medicine Teaching Service Daily Progress Note Intern Pager: [phone]  Patient name: Bruce [name] Medical record number: [id number] Date of birth: 09/14/1965 Age: 55 y.o. Gender: male  Primary Care Provider: Patient, No Pcp Per (Inactive) Consultants: none   Code Status: Full Code  Pt Overview and Major Events to Date:  4/12 admitted  Assessment and Plan: Bruce [name] is a 55 y.o. male who presents with chest pain and severe HTN in the setting of running out of his antihypertensives for 2 weeks. PMHx significant for HTN, asthma, allergic rhinitis.  Severe HTN No evidence of end organ damage on imaging, no evidence of aortic dissection of CTA PE though not the gold standard. Also no mediastinal widening on CXR. Asymptomatic at this time though without chest pain but still having significant nausea and vomiting. BP slightly improved, 185/95 this AM.  He thinks he may have vomited up his labetalol dose yesterday.  Serum creatinine has normalized this morning.  We will continue to try oral antihypertensives, but may consider IV if nausea and vomiting continues to pose a problem. - restart home lisinopril-HCTZ 20-25 mg (separated here) - labetalol 200 mg BID  Nausea/vomiting Suspect related to THC use, low concern for acute abdominal process or infection at this time. Afebrile this AM. Reports 3 episodes of vomiting last night and reports feeling dehydrated this morning despite being on IV fluids..  Continue supportive care. - increase mIVF LR 125 ml/hr - Zofran q6 prn - consider abdominal imaging if febrile  Allergic rhinitis No sinus tenderness to suggest acute sinusitis.  Offered nasal spray, but patient declined.  He is concerned it will irritate the hole in his nasal septum.  Postnasal drip possibly contributing to nausea and vomiting. - loratadine 10 mg daily, consider diphenhydramine if not effective  Hypokalemia Likely secondary to GI losses from vomiting.   Potassium 3.2 this morning. - KCl tablet 40 mEq once  Asthma - home albuterol inhaler prn  FEN/GI: heart healthy diet, mIVF LR 125 cc/hr PPx: LMWH  Disposition: med-tele, possible dc today or tomorrow  Subjective:  NAOE.  Patient is still feeling nauseous and states he had 3 episodes of vomiting last night.  He was unable to tolerate ice chips without feeling nauseous.  He thinks he may have vomited up his labetalol medication last night.  Denies chest pain this morning.  He feels dehydrated and feels he is not getting enough fluids in his IV.  Objective: Temp:  [97.8 F (36.6 C)-100.4 F (38 C)] 97.8 F (36.6 C) (04/13 0501) Pulse Rate:  [64-101] 65 (04/13 0501) Resp:  [8-20] 20 (04/13 0501) BP: (158-235)/(95-137) 185/95 (04/13 0501) SpO2:  [90 %-100 %] 100 % (04/13 0501) Weight:  [95.3 kg] 95.3 kg (04/12 1049) Physical Exam: General: Overweight male sitting in chair, NAD HEENT: MM slightly dry, no frontal or maxillary sinus tenderness Cardiovascular: RRR, no murmurs Respiratory: CTAB, no respiratory distress Abdomen: soft, non-tender, +BS Extremities: WWP, no edema  Laboratory: Recent Labs  Lab 11/01/20 1100 11/02/20 0338  WBC 12.2* 11.4*  HGB 17.0 16.5  HCT 48.4 47.2  PLT 231 226   Recent Labs  Lab 11/01/20 1100 11/02/20 0338  NA 138 139  K 4.1 3.2*  CL 102 100  CO2 25 28  BUN 14 10  CREATININE 1.33* 1.11  CALCIUM 9.5 9.3  PROT 7.2  --   BILITOT 1.4*  --   ALKPHOS 69  --   ALT 18  --   AST 30  --  GLUCOSE 129* 88     Imaging/Diagnostic Tests: CT Angio Chest PE W/Cm &/Or Wo Cm  Result Date: 11/01/2020 CLINICAL DATA:  Chest pain and shortness of breath EXAM: CT ANGIOGRAPHY CHEST WITH CONTRAST TECHNIQUE: Multidetector CT imaging of the chest was performed using the standard protocol during bolus administration of intravenous contrast. Multiplanar CT image reconstructions and MIPs were obtained to evaluate the vascular anatomy. CONTRAST:  21mL  OMNIPAQUE IOHEXOL 300 MG/ML  SOLN COMPARISON:  None. FINDINGS: Cardiovascular: Atherosclerotic calcifications of the thoracic aorta are noted without aneurysmal dilatation. Coronary calcifications are seen. No cardiac enlargement is noted. Pulmonary artery shows a normal branching pattern bilaterally. No filling defect to suggest pulmonary embolism is identified. Mediastinum/Nodes: Thoracic inlet is within normal limits. No hilar or mediastinal adenopathy is noted. The esophagus as visualized is within normal limits. Lungs/Pleura: Lungs are well aerated bilaterally. No focal infiltrate or sizable effusion is seen. Scattered small subpleural nodules are noted measuring less than 5 mm bilaterally. No sizable nodule is seen. No effusion is noted. Upper Abdomen: Visualized upper abdomen is within normal limits. Musculoskeletal: No chest wall abnormality. No acute or significant osseous findings. Review of the MIP images confirms the above findings. IMPRESSION: No evidence of pulmonary emboli. Multiple small less than 5 mm subpleural nodules bilaterally. No follow-up needed if patient is low-risk (and has no known or suspected primary neoplasm). Non-contrast chest CT can be considered in 12 months if patient is high-risk. This recommendation follows the consensus statement: Guidelines for Management of Incidental Pulmonary Nodules Detected on CT Images: From the Fleischner Society 2017; Radiology 2017; 284:228-243. No other focal abnormality is noted. Aortic Atherosclerosis (ICD10-I70.0). Electronically Signed   By: Alcide Clever M.D.   On: 11/01/2020 15:12   DG Chest Portable 1 View  Result Date: 11/01/2020 CLINICAL DATA:  Chest pain EXAM: PORTABLE CHEST 1 VIEW COMPARISON:  October 02, 2019 FINDINGS: Lungs are clear. Heart size and pulmonary vascularity are normal. No adenopathy. No bone lesions. No pneumothorax. IMPRESSION: Lungs clear.  Cardiac silhouette normal. Electronically Signed   By: Bretta Bang III M.D.    On: 11/01/2020 12:06     Littie Deeds, MD 11/02/2020, 7:22 AM PGY-1, [facility] Family Medicine FPTS Intern pager: [phone], text pages welcome

## 2020-11-02 NOTE — Progress Notes (Signed)
Pt expressed that Zyrtec has really helped his allergies in the past and he really needs something to help with congestion and drainage so he won't be so stopped up. He said he is extremely dehydrated and his stomach feels queezy from not eating. He also expressed could he get a shower order if possible because the heat from the hot shower can help drain his sinuses.

## 2020-11-02 NOTE — TOC Initial Note (Signed)
Transition of Care [facility]) - Initial/Assessment Note    Patient Details  Name: Bruce [name] MRN: [id number] Date of Birth: 1966-03-16  Transition of [facility]) CM/SW Contact:    Bethann Berkshire, [place] Phone Number: 11/02/2020, 10:52 AM  Clinical Narrative:                  CSW met with pt in response to [facility] consult for medication assistance. Pt lives at home with his spouse and kid. Pt states he has insurance and his primary barrier to medication is that he does not have a current PCP because his previous PCP moved. When discussing possible PCP options pt states he wants to continue with Family Medicine team here at the hospital. [place] followed up with Family Medicine  Who explained that could be an option. They would provide pt with a new pt packet and once pt completed and returned it, they would schedule a PCP appointment.   Expected Discharge Plan: Home/Self Care Barriers to Discharge: Continued Medical Work up   Patient Goals and CMS Choice        Expected Discharge Plan and Services Expected Discharge Plan: Home/Self Care       Living arrangements for the past 2 months: Single Family Home                                      Prior Living Arrangements/Services Living arrangements for the past 2 months: Single Family Home Lives with:: Minor Children,Spouse          Need for Family Participation in Patient Care: No (Comment) Care giver support system in place?: Yes (comment)      Activities of Daily Living Home Assistive Devices/Equipment: None ADL Screening (condition at time of admission) Patient's cognitive ability adequate to safely complete daily activities?: Yes Is the patient deaf or have difficulty hearing?: No Does the patient have difficulty seeing, even when wearing glasses/contacts?: No Does the patient have difficulty concentrating, remembering, or making decisions?: No Patient able to express need for assistance with ADLs?: No Does the  patient have difficulty dressing or bathing?: No Independently performs ADLs?: Yes (appropriate for developmental age) Does the patient have difficulty walking or climbing stairs?: No Weakness of Legs: None Weakness of Arms/Hands: None  Permission Sought/Granted                  Emotional Assessment   Attitude/Demeanor/Rapport: Engaged Affect (typically observed): Accepting Orientation: : Oriented to Self,Oriented to Place,Oriented to  Time,Oriented to Situation Alcohol / Substance Use: Not Applicable Psych Involvement: No (comment)  Admission diagnosis:  Lung nodule [R91.1] Hypertensive urgency [I16.0] Severe hypertension [I10] Patient Active Problem List   Diagnosis Date Noted  . Hypertensive urgency   . Lung nodule   . Severe hypertension 11/01/2020  . AKI (acute kidney injury) ([place]) 10/06/2019  . Intractable nausea and vomiting 10/06/2019  . HTN (hypertension) 10/06/2019   PCP:  Patient, No Pcp Per (Inactive) Pharmacy:   [facility], [place] [address] [place] [address] Phone: [phone] Fax: [place] [address] [address] Phone: [phone] Fax: [phone]     Social Determinants of Health (SDOH) Interventions    Readmission Risk Interventions No flowsheet data found.

## 2020-11-02 NOTE — Plan of Care (Signed)

## 2020-11-02 NOTE — Discharge Instructions (Signed)
Dear Bruce [name],   Thank you so much for allowing [place] to be part of your care!  You were admitted to [facility] for high blood pressure.  Your chest x-ray and CT scan were all normal.  You were treated with medications to bring your blood pressure down.  For your nausea and vomiting, you were treated with IV fluids and nausea medications.  Please make sure to take your blood pressure medication every day to prevent your blood pressures from becoming more elevated.  You will need to follow up at the [facility] for further medication recommendations.   [facility]  [address]  [place] [address]   11/08/20  1:30 PM Please arrive at 1:15 for your hospital follow up   POST-HOSPITAL & CARE INSTRUCTIONS 1. Please be sure to complete the new patient packet and return it to the [facility] to establish care. 2. Please take lisinopril-hydrochlorothiazide 20-25mg  daily.  3. If you continue to have nausea, you have been prescribed Zofran 4mg . 4. We have prescribed Claritin 10mg  to help with your nasal drainage.  5. Talk to your doctor about starting a cholesterol medicine. 6. We recommend you stop using marijuana, it is likely related to the nausea and vomiting you have been having. 7. Please let PCP/Specialists know of any changes that were made.  8. Please see medications section of this packet for any medication changes.    RETURN PRECAUTIONS: Please return to the ED if you develop severe chest pain, shortness of breath, muscle weakness.  Take care and be well!  Family Medicine Teaching Service  [place]  [facility]  [address] [place], [address] [place] [phone]

## 2020-11-03 NOTE — Discharge Summary (Signed)
Family Medicine Teaching [facility] Discharge Summary  Patient name: Arbor Einar Nolasco Medical record number: [id number] Date of birth: 14-May-1966 Age: 55 y.o. Gender: male Date of Admission: 11/01/2020  Date of Discharge: 11/03/2020  Admitting Physician: Littie Deeds, MD  Primary Care Provider: Patient, No Pcp Per (Inactive) Consultants: none  Indication for Hospitalization: Severe HTN  Discharge Diagnoses/Problem List:  Active Problems:   Severe hypertension   Hypertensive urgency   Lung nodule   Nausea/vomiting   Allergic rhinitis   Asthma  Disposition: home  Discharge Condition: Stable  Discharge Exam:  General: Overweight male sitting in chair, NAD HEENT: MM slightly dry, no frontal or maxillary sinus tenderness Cardiovascular: RRR, no murmurs Respiratory: CTAB, no respiratory distress Abdomen: soft, non-tender, +BS Extremities: WWP, no edema   Brief Hospital Course:  Iley Tesean Stump is a 55 y.o. male with a history of HTN, asthma, allergic rhinitis who presented with intermittent chest pain found to have severe range HTN. Hospital course outlined by problem below:  Severe HTN On presentation to the ED, patient was mildly febrile to 100.4 F and hypertensive up to 204/117 initially.  He had some borderline ST changes on EKG with minimal ST depression in the inferior leads and minimal ST elevation in anterior leads and aVL, though not significantly changed from previous EKG.  EKG was reviewed by cardiologist who did not feel he had a STEMI.  Troponins obtained were flat at 19 and 19.  He had a CXR which was unremarkable and a CTA of the chest which was negative for PE and low likelihood of aortic dissection.  Initial creatinine of 1.33 which is close to his baseline.  He was given multiple antihypertensives in the ED, including labetalol, amlodipine, hydralazine, and HCTZ.  Given no signs of endorgan damage, he was treated with oral medications, started on oral  labetalol 200 mg twice daily and he was restarted on his home lisinopril 20 mg and HCTZ 25 mg dose the following morning (as he received an IV contrast study the day of admission). He had no chest pain during admission. Prior to discharge, his BP had improved to 160/114 and he was discharged on the antihypertensive regimen including: lisinopril-hydrochlorothiazide 20-25 mg daily.  Nausea/vomiting Thought to be related to [facility] use.  Initial low-grade fever resolved on next recheck and he remained afebrile throughout the remainder of admission.  He was treated supportively with IV fluids given poor oral intake and Zofran as needed .  Prior to discharge, nausea and vomiting had significant improvement he was able to tolerate food and medications.  Allergic rhinitis He has had significant postnasal drip with cough prior to admission.  Started on loratadine 10 mg daily during admission.   Other problems chronic and stable.  Follow-up issues: 1. CT angio-no evidence of PE, multiple small <5 mm subpleural nodules bilaterally, no follow-up if patient is low risk with no known or suspected primary neoplasm, noncontrast CT considered in 12 months if patient is high risk. Patient does have smoking history so consider repeat imaging. 2. HbA1c 5.9% in pre-diabetes range. 3. Mildly abnormal lipid panel with total cholesterol 213, LDL 142. Consider starting statin based on ASCVD risk. 4. Please evaluate Blood pressure, if remains elevated, patient may need additional antihypertensive medication.      Significant Procedures: none  Significant Labs and Imaging:  Recent Labs  Lab 11/01/20 1100 11/02/20 0338  WBC 12.2* 11.4*  HGB 17.0 16.5  HCT 48.4 47.2  PLT 231 226   Recent  Labs  Lab 11/01/20 1100 11/02/20 0338  NA 138 139  K 4.1 3.2*  CL 102 100  CO2 25 28  GLUCOSE 129* 88  BUN 14 10  CREATININE 1.33* 1.11  CALCIUM 9.5 9.3  ALKPHOS 69  --   AST 30  --   ALT 18  --   ALBUMIN 4.0  --      CT Angio Chest PE W/Cm &/Or Wo Cm  Result Date: 11/01/2020 CLINICAL DATA:  Chest pain and shortness of breath EXAM: CT ANGIOGRAPHY CHEST WITH CONTRAST TECHNIQUE: Multidetector CT imaging of the chest was performed using the standard protocol during bolus administration of intravenous contrast. Multiplanar CT image reconstructions and MIPs were obtained to evaluate the vascular anatomy. CONTRAST:  14mL OMNIPAQUE IOHEXOL 300 MG/ML  SOLN COMPARISON:  None. FINDINGS: Cardiovascular: Atherosclerotic calcifications of the thoracic aorta are noted without aneurysmal dilatation. Coronary calcifications are seen. No cardiac enlargement is noted. Pulmonary artery shows a normal branching pattern bilaterally. No filling defect to suggest pulmonary embolism is identified. Mediastinum/Nodes: Thoracic inlet is within normal limits. No hilar or mediastinal adenopathy is noted. The esophagus as visualized is within normal limits. Lungs/Pleura: Lungs are well aerated bilaterally. No focal infiltrate or sizable effusion is seen. Scattered small subpleural nodules are noted measuring less than 5 mm bilaterally. No sizable nodule is seen. No effusion is noted. Upper Abdomen: Visualized upper abdomen is within normal limits. Musculoskeletal: No chest wall abnormality. No acute or significant osseous findings. Review of the MIP images confirms the above findings. IMPRESSION: No evidence of pulmonary emboli. Multiple small less than 5 mm subpleural nodules bilaterally. No follow-up needed if patient is low-risk (and has no known or suspected primary neoplasm). Non-contrast chest CT can be considered in 12 months if patient is high-risk. This recommendation follows the consensus statement: Guidelines for Management of Incidental Pulmonary Nodules Detected on CT Images: From the Fleischner Society 2017; Radiology 2017; 284:228-243. No other focal abnormality is noted. Aortic Atherosclerosis (ICD10-I70.0). Electronically Signed   By:  Alcide Clever M.D.   On: 11/01/2020 15:12   DG Chest Portable 1 View  Result Date: 11/01/2020 CLINICAL DATA:  Chest pain EXAM: PORTABLE CHEST 1 VIEW COMPARISON:  October 02, 2019 FINDINGS: Lungs are clear. Heart size and pulmonary vascularity are normal. No adenopathy. No bone lesions. No pneumothorax. IMPRESSION: Lungs clear.  Cardiac silhouette normal. Electronically Signed   By: Bretta Bang III M.D.   On: 11/01/2020 12:06     Results/Tests Pending at Time of Discharge: none  Discharge Medications:  Allergies as of 11/02/2020      Reactions   Penicillins Anaphylaxis   Did it involve swelling of the face/tongue/throat, SOB, or low BP? No Did it involve sudden or severe rash/hives, skin peeling, or any reaction on the inside of your mouth or nose?Yes Did you need to seek medical attention at a hospital or doctor's office? Yes When did it last happen?Child  If all above answers are "NO", may proceed with cephalosporin use.   Beef-derived Products Other (See Comments)   Pt preference    Pork-derived Products Other (See Comments)   Pt preference       Medication List    STOP taking these medications   cetirizine 10 MG tablet Commonly known as: ZyrTEC Allergy Replaced by: loratadine 10 MG tablet     TAKE these medications   albuterol 108 (90 Base) MCG/ACT inhaler Commonly known as: VENTOLIN HFA Inhale 2 puffs into the lungs every 4 (four) hours  as needed for wheezing or shortness of breath.   lisinopril-hydrochlorothiazide 20-25 MG tablet Commonly known as: ZESTORETIC Take 1 tablet by mouth daily.   loratadine 10 MG tablet Commonly known as: CLARITIN Take 1 tablet (10 mg total) by mouth daily. Replaces: cetirizine 10 MG tablet   ondansetron 4 MG tablet Commonly known as: ZOFRAN Take 1 tablet (4 mg total) by mouth every 8 (eight) hours as needed for nausea or vomiting.       Discharge Instructions: Please refer to Patient Instructions section of EMR for full  details.  Patient was counseled important signs and symptoms that should prompt return to medical care, changes in medications, dietary instructions, activity restrictions, and follow up appointments.   Follow-Up Appointments:   Littie Deeds, MD 11/03/2020, 7:29 AM PGY-1, [facility] Family Medicine

## 2020-11-08 ENCOUNTER — Inpatient Hospital Stay: Payer: 59

## 2020-12-31 ENCOUNTER — Other Ambulatory Visit: Payer: Self-pay | Admitting: Family Medicine

## 2021-02-17 ENCOUNTER — Encounter (HOSPITAL_COMMUNITY): Payer: Self-pay

## 2021-02-17 ENCOUNTER — Other Ambulatory Visit: Payer: Self-pay

## 2021-02-17 ENCOUNTER — Emergency Department (HOSPITAL_COMMUNITY)
Admission: EM | Admit: 2021-02-17 | Discharge: 2021-02-17 | Disposition: A | Discharge status: Home / Self Care | Payer: 59 | Attending: Emergency Medicine | Admitting: Emergency Medicine

## 2021-02-17 DIAGNOSIS — R197 Diarrhea, unspecified: Secondary | ICD-10-CM | POA: Diagnosis not present

## 2021-02-17 DIAGNOSIS — Z79899 Other long term (current) drug therapy: Secondary | ICD-10-CM | POA: Insufficient documentation

## 2021-02-17 DIAGNOSIS — R1084 Generalized abdominal pain: Secondary | ICD-10-CM | POA: Insufficient documentation

## 2021-02-17 DIAGNOSIS — R112 Nausea with vomiting, unspecified: Secondary | ICD-10-CM | POA: Insufficient documentation

## 2021-02-17 DIAGNOSIS — R062 Wheezing: Secondary | ICD-10-CM | POA: Insufficient documentation

## 2021-02-17 DIAGNOSIS — I10 Essential (primary) hypertension: Secondary | ICD-10-CM | POA: Diagnosis not present

## 2021-02-17 DIAGNOSIS — F17210 Nicotine dependence, cigarettes, uncomplicated: Secondary | ICD-10-CM | POA: Insufficient documentation

## 2021-02-17 LAB — CBC
HCT: 45.3 % (ref 39.0–52.0)
Hemoglobin: 15.7 g/dL (ref 13.0–17.0)
MCH: 33.1 pg (ref 26.0–34.0)
MCHC: 34.7 g/dL (ref 30.0–36.0)
MCV: 95.4 fL (ref 80.0–100.0)
Platelets: 275 10*3/uL (ref 150–400)
RBC: 4.75 MIL/uL (ref 4.22–5.81)
RDW: 13.1 % (ref 11.5–15.5)
WBC: 11 10*3/uL — ABNORMAL HIGH (ref 4.0–10.5)
nRBC: 0 % (ref 0.0–0.2)

## 2021-02-17 LAB — URINALYSIS, ROUTINE W REFLEX MICROSCOPIC
Bilirubin Urine: NEGATIVE
Glucose, UA: NEGATIVE mg/dL
Hgb urine dipstick: NEGATIVE
Ketones, ur: 20 mg/dL — AB
Leukocytes,Ua: NEGATIVE
Nitrite: NEGATIVE
Protein, ur: 30 mg/dL — AB
Specific Gravity, Urine: 1.025 (ref 1.005–1.030)
pH: 6 (ref 5.0–8.0)

## 2021-02-17 LAB — COMPREHENSIVE METABOLIC PANEL
ALT: 15 U/L (ref 0–44)
AST: 18 U/L (ref 15–41)
Albumin: 4.1 g/dL (ref 3.5–5.0)
Alkaline Phosphatase: 68 U/L (ref 38–126)
Anion gap: 11 (ref 5–15)
BUN: 10 mg/dL (ref 6–20)
CO2: 24 mmol/L (ref 22–32)
Calcium: 9.5 mg/dL (ref 8.9–10.3)
Chloride: 103 mmol/L (ref 98–111)
Creatinine, Ser: 1.13 mg/dL (ref 0.61–1.24)
GFR, Estimated: >60 mL/min (ref >60)
Glucose, Bld: 114 mg/dL — ABNORMAL HIGH (ref 70–99)
Potassium: 3.3 mmol/L — ABNORMAL LOW (ref 3.5–5.1)
Sodium: 138 mmol/L (ref 135–145)
Total Bilirubin: 0.8 mg/dL (ref 0.3–1.2)
Total Protein: 7.4 g/dL (ref 6.5–8.1)

## 2021-02-17 LAB — LIPASE, BLOOD: Lipase: 41 U/L (ref 11–51)

## 2021-02-17 MED ORDER — ONDANSETRON HCL 4 MG/2ML IJ SOLN
4.0000 mg | Freq: Once | INTRAMUSCULAR | Status: AC
  Administered 2021-02-17: 4 mg via INTRAVENOUS
  Filled 2021-02-17: qty 2

## 2021-02-17 MED ORDER — ALBUTEROL SULFATE HFA 108 (90 BASE) MCG/ACT IN AERS
2.0000 | INHALATION_SPRAY | Freq: Once | RESPIRATORY_TRACT | Status: AC
  Administered 2021-02-17: 2 via RESPIRATORY_TRACT
  Filled 2021-02-17: qty 6.7

## 2021-02-17 MED ORDER — POTASSIUM CHLORIDE CRYS ER 20 MEQ PO TBCR
40.0000 meq | EXTENDED_RELEASE_TABLET | Freq: Once | ORAL | Status: AC
  Administered 2021-02-17: 40 meq via ORAL
  Filled 2021-02-17: qty 2

## 2021-02-17 MED ORDER — SODIUM CHLORIDE 0.9 % IV BOLUS
1000.0000 mL | Freq: Once | INTRAVENOUS | Status: AC
  Administered 2021-02-17: 1000 mL via INTRAVENOUS

## 2021-02-17 MED ORDER — LORATADINE 10 MG PO TABS: 10.0000 mg | ORAL_TABLET | Freq: Every day | ORAL | 0 refills | Status: DC

## 2021-02-17 NOTE — ED Triage Notes (Signed)
Patient complains of abdominal pain and cramping x 3 days. States that it started with diarrhea that has resolved and now vomiting. Patient states that he thinks he has food poisoning.

## 2021-02-17 NOTE — ED Provider Notes (Signed)
I received this patient in signout from [facility] [place].  Please see his documentation for further details.  55 year old male presented to ER with NVD, possibly related to's of that he ate 3 days ago, suspect food poisoning.  Generally benign exam, initially tachycardic but this is improved with fluids.  Plan: IV fluids and antiemetics -> road test and p.o. challenge -> home  Physical Exam  BP (!) 158/112   Pulse 84   Temp 98.6 F (37 C) (Oral)   Resp 16   SpO2 98%   Physical Exam Vitals and nursing note reviewed.  Constitutional:      General: He is not in acute distress. HENT:     Head: Normocephalic and atraumatic.  Eyes:     Extraocular Movements: Extraocular movements intact.     Conjunctiva/sclera: Conjunctivae normal.  Cardiovascular:     Rate and Rhythm: Normal rate and regular rhythm.  Pulmonary:     Effort: Pulmonary effort is normal.  Abdominal:     Palpations: Abdomen is soft.     Tenderness: There is no abdominal tenderness.  Musculoskeletal:        General: No deformity or signs of injury.     Cervical back: Normal range of motion.  Skin:    General: Skin is warm and dry.  Neurological:     Mental Status: He is alert and oriented to person, place, and time. Mental status is at baseline.  Psychiatric:        Mood and Affect: Mood normal.        Behavior: Behavior normal.    ED Course/Procedures     Procedures  MDM   Patient reevaluated for signout, HDS, resting comfortably, feels much improved after fluids and antiemetics.  Noted that he has some wheezing, with Hx asthma.  Albuterol administered with improvement in symptoms.  Additionally states that he has not had his Claritin filled as he is in the process of trying to find a PCP, so 1 month course prescribed.  Patient passed road test and p.o. challenge.  Therefore, feel he is stable for discharge home with close outpatient follow-up.  Encouraged good p.o. intake and balanced fluids as he  continues to work through his gastroenteritis.  Return precautions discussed.  He understands and agrees.  Patient HDS on reevaluation, ambulatory, nontoxic-appearing, tolerating p.o.  Subsequently discharged.  1. Nausea vomiting and diarrhea   2. Wheezing       Colvin Caroli, MD 02/18/21 0086    Rolan Bucco, MD 02/18/21 1058

## 2021-02-17 NOTE — Discharge Instructions (Signed)
You were evaluated in the ER for your nausea, vomiting, and diarrhea.  Our work-up was reassuring against any significantly threatening disease.  You are additionally given some albuterol for your wheezing.  Your home Claritin was also refilled.  You can take over-the-counter antacid drugs for your stomach irritation.  Continue to hydrate well.  Follow-up with [place] [facility] (contact information attached).  Please return to the ER for any worsening, including significant vomiting and diarrhea that will not stop, fainting episodes, new fevers or chills.
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
## 2021-03-14 ENCOUNTER — Emergency Department (HOSPITAL_COMMUNITY)
Admission: EM | Admit: 2021-03-14 | Discharge: 2021-03-14 | Disposition: A | Discharge status: Home / Self Care | Payer: 59 | Attending: Emergency Medicine | Admitting: Emergency Medicine

## 2021-03-14 ENCOUNTER — Emergency Department (HOSPITAL_COMMUNITY): Payer: 59

## 2021-03-14 DIAGNOSIS — M12811 Other specific arthropathies, not elsewhere classified, right shoulder: Secondary | ICD-10-CM | POA: Insufficient documentation

## 2021-03-14 DIAGNOSIS — M25511 Pain in right shoulder: Secondary | ICD-10-CM | POA: Diagnosis present

## 2021-03-14 DIAGNOSIS — Z79899 Other long term (current) drug therapy: Secondary | ICD-10-CM | POA: Insufficient documentation

## 2021-03-14 DIAGNOSIS — F17210 Nicotine dependence, cigarettes, uncomplicated: Secondary | ICD-10-CM | POA: Diagnosis not present

## 2021-03-14 DIAGNOSIS — I10 Essential (primary) hypertension: Secondary | ICD-10-CM | POA: Insufficient documentation

## 2021-03-14 DIAGNOSIS — M19019 Primary osteoarthritis, unspecified shoulder: Secondary | ICD-10-CM

## 2021-03-14 MED ORDER — METHOCARBAMOL 500 MG PO TABS: 500.0000 mg | ORAL_TABLET | Freq: Two times a day (BID) | ORAL | 0 refills | Status: DC

## 2021-03-14 MED ORDER — NAPROXEN 500 MG PO TABS: 500.0000 mg | ORAL_TABLET | Freq: Two times a day (BID) | ORAL | 0 refills | Status: DC

## 2021-03-14 MED ORDER — OXYCODONE-ACETAMINOPHEN 5-325 MG PO TABS: 1.0000 | ORAL_TABLET | Freq: Four times a day (QID) | ORAL | 0 refills | Status: DC | PRN

## 2021-03-14 MED ORDER — LISINOPRIL-HYDROCHLOROTHIAZIDE 20-25 MG PO TABS: 1.0000 | ORAL_TABLET | Freq: Every day | ORAL | 0 refills | Status: DC

## 2021-03-14 MED ORDER — OXYCODONE-ACETAMINOPHEN 5-325 MG PO TABS
1.0000 | ORAL_TABLET | Freq: Once | ORAL | Status: AC
  Administered 2021-03-14: 1 via ORAL
  Filled 2021-03-14: qty 1

## 2021-03-14 MED ORDER — LIDOCAINE 5 % EX PTCH
1.0000 | MEDICATED_PATCH | CUTANEOUS | Status: DC
  Administered 2021-03-14: 1 via TRANSDERMAL
  Filled 2021-03-14: qty 1

## 2021-03-14 NOTE — ED Provider Notes (Signed)
Emergency Medicine Provider Triage Evaluation Note  Bruce [name] , a 55 y.o. male  was evaluated in triage.  Pt complains of gradual onset, intermittent, right scapula/shoulder pain for the past few months; constant over the past few days. In April he was seen in the ED for same however BP significantly elevated and admitted to hypertensive urgency at that time. Reports pain has been coming and going; has not followed up with anyone for same. No new trauma. Does not radiate to chest. Troponins negative x 2 and CTA negative for PE during last ED visit  Review of Systems  Positive: + R scapula pain Negative: - SOB, chest pain  Physical Exam  BP (!) 158/113 (BP Location: Left Arm)   Pulse (!) 131   Temp 98.3 F (36.8 C) (Oral)   Resp 20   SpO2 100%  Gen:   Awake, no distress   Resp:  Normal effort  MSK:   Moves extremities without difficulty  Other:  + right scapular TTP. ROM intact to RUE. 2+ radial pulse.   Medical Decision Making  Medically screening exam initiated at 9:06 AM.  Appropriate orders placed.  Bruce [name] was informed that the remainder of the evaluation will be completed by another provider, this initial triage assessment does not replace that evaluation, and the importance of remaining in the ED until their evaluation is complete.     Tanda Rockers, PA-C 03/14/21 5009    Rolan Bucco, MD 03/14/21 1054

## 2021-03-14 NOTE — Discharge Instructions (Addendum)
Please pick up medication and take as prescribed. I would recommend taking the naproxen at first and to only take the percocet for break through pain.   DO NOT DRIVE OR DRINK ALCOHOL WHILE ON THE MUSCLE RELAXER  Use the shoulder sling as indicated. You can buy OTC salon pas patches and use as directed. Try warm compresses or ice to see which one helps more.   Follow up with Dr. Roda Shutters Orthopedist for further evaluation of your pain  I have also prescribed 1 months worth of blood pressure medication. You will need to follow up with PCP for refills. You can follow up with [facility] if you do not have a PCP/insurance.   Return to the ED for any new/worsening symptoms
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
## 2021-05-01 ENCOUNTER — Ambulatory Visit (HOSPITAL_COMMUNITY)
Admission: EM | Admit: 2021-05-01 | Discharge: 2021-05-01 | Disposition: A | Discharge status: Home / Self Care | Payer: 59 | Attending: Internal Medicine | Admitting: Internal Medicine

## 2021-05-01 ENCOUNTER — Other Ambulatory Visit: Payer: Self-pay

## 2021-05-01 ENCOUNTER — Encounter (HOSPITAL_COMMUNITY): Payer: Self-pay

## 2021-05-01 DIAGNOSIS — I10 Essential (primary) hypertension: Secondary | ICD-10-CM | POA: Insufficient documentation

## 2021-05-01 DIAGNOSIS — A084 Viral intestinal infection, unspecified: Secondary | ICD-10-CM | POA: Insufficient documentation

## 2021-05-01 LAB — CBC WITH DIFFERENTIAL/PLATELET
Abs Immature Granulocytes: 0.06 10*3/uL (ref 0.00–0.07)
Basophils Absolute: 0 10*3/uL (ref 0.0–0.1)
Basophils Relative: 0 %
Eosinophils Absolute: 0 10*3/uL (ref 0.0–0.5)
Eosinophils Relative: 0 %
HCT: 47.4 % (ref 39.0–52.0)
Hemoglobin: 17 g/dL (ref 13.0–17.0)
Immature Granulocytes: 0 %
Lymphocytes Relative: 13 %
Lymphs Abs: 1.8 10*3/uL (ref 0.7–4.0)
MCH: 33.1 pg (ref 26.0–34.0)
MCHC: 35.9 g/dL (ref 30.0–36.0)
MCV: 92.4 fL (ref 80.0–100.0)
Monocytes Absolute: 0.9 10*3/uL (ref 0.1–1.0)
Monocytes Relative: 7 %
Neutro Abs: 11.1 10*3/uL — ABNORMAL HIGH (ref 1.7–7.7)
Neutrophils Relative %: 80 %
Platelets: 247 10*3/uL (ref 150–400)
RBC: 5.13 MIL/uL (ref 4.22–5.81)
RDW: 13.4 % (ref 11.5–15.5)
WBC: 13.9 10*3/uL — ABNORMAL HIGH (ref 4.0–10.5)
nRBC: 0 % (ref 0.0–0.2)

## 2021-05-01 LAB — COMPREHENSIVE METABOLIC PANEL
ALT: 19 U/L (ref 0–44)
AST: 23 U/L (ref 15–41)
Albumin: 4.6 g/dL (ref 3.5–5.0)
Alkaline Phosphatase: 68 U/L (ref 38–126)
Anion gap: 11 (ref 5–15)
BUN: 9 mg/dL (ref 6–20)
CO2: 27 mmol/L (ref 22–32)
Calcium: 10.1 mg/dL (ref 8.9–10.3)
Chloride: 102 mmol/L (ref 98–111)
Creatinine, Ser: 1.16 mg/dL (ref 0.61–1.24)
GFR, Estimated: >60 mL/min (ref >60)
Glucose, Bld: 130 mg/dL — ABNORMAL HIGH (ref 70–99)
Potassium: 3.7 mmol/L (ref 3.5–5.1)
Sodium: 140 mmol/L (ref 135–145)
Total Bilirubin: 1.1 mg/dL (ref 0.3–1.2)
Total Protein: 7.8 g/dL (ref 6.5–8.1)

## 2021-05-01 MED ORDER — ONDANSETRON 4 MG PO TBDP: 4.0000 mg | ORAL_TABLET | Freq: Three times a day (TID) | ORAL | 0 refills | Status: AC | PRN

## 2021-05-01 MED ORDER — ONDANSETRON 4 MG PO TBDP
ORAL_TABLET | ORAL | Status: AC
  Filled 2021-05-01: qty 1

## 2021-05-01 MED ORDER — PANTOPRAZOLE SODIUM 20 MG PO TBEC: 20.0000 mg | DELAYED_RELEASE_TABLET | Freq: Every day | ORAL | 1 refills | Status: DC

## 2021-05-01 MED ORDER — LISINOPRIL-HYDROCHLOROTHIAZIDE 20-25 MG PO TABS: 1.0000 | ORAL_TABLET | Freq: Every day | ORAL | 0 refills | Status: DC

## 2021-05-01 MED ORDER — ONDANSETRON 4 MG PO TBDP
4.0000 mg | ORAL_TABLET | Freq: Once | ORAL | Status: AC
  Administered 2021-05-01: 4 mg via ORAL
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
## 2021-05-01 NOTE — ED Triage Notes (Signed)
Pt presents with generalized abdominal pain and vomiting since yesterday. 

## 2021-05-01 NOTE — Discharge Instructions (Addendum)
Please take medications as prescribed Increase oral fluid intake We will call you with recommendations if labs are abnormal Please make an appointment with your primary care physician.  You will benefit from a colonoscopy to further evaluate your weight loss Return to urgent care if symptoms worsen. If you develop headache, persistent chest pain, or worsening abdominal pain please go to the emergency room for further evaluation.

## 2021-05-03 ENCOUNTER — Ambulatory Visit: Payer: 59 | Admitting: Physician Assistant

## 2021-05-16 ENCOUNTER — Other Ambulatory Visit: Payer: Self-pay

## 2021-05-16 ENCOUNTER — Ambulatory Visit: Payer: 59 | Attending: Physician Assistant | Admitting: Internal Medicine

## 2021-05-16 ENCOUNTER — Encounter: Payer: Self-pay | Admitting: Internal Medicine

## 2021-05-16 VITALS — BP 160/100 | HR 82 | Resp 16 | Ht 72.0 in | Wt 206.2 lb

## 2021-05-16 DIAGNOSIS — E782 Mixed hyperlipidemia: Secondary | ICD-10-CM

## 2021-05-16 DIAGNOSIS — R918 Other nonspecific abnormal finding of lung field: Secondary | ICD-10-CM

## 2021-05-16 DIAGNOSIS — F17200 Nicotine dependence, unspecified, uncomplicated: Secondary | ICD-10-CM

## 2021-05-16 DIAGNOSIS — R0982 Postnasal drip: Secondary | ICD-10-CM

## 2021-05-16 DIAGNOSIS — Z7689 Persons encountering health services in other specified circumstances: Secondary | ICD-10-CM

## 2021-05-16 DIAGNOSIS — R634 Abnormal weight loss: Secondary | ICD-10-CM

## 2021-05-16 DIAGNOSIS — Z2821 Immunization not carried out because of patient refusal: Secondary | ICD-10-CM

## 2021-05-16 DIAGNOSIS — Z23 Encounter for immunization: Secondary | ICD-10-CM | POA: Diagnosis not present

## 2021-05-16 DIAGNOSIS — M25512 Pain in left shoulder: Secondary | ICD-10-CM

## 2021-05-16 DIAGNOSIS — Z1159 Encounter for screening for other viral diseases: Secondary | ICD-10-CM

## 2021-05-16 DIAGNOSIS — Z1211 Encounter for screening for malignant neoplasm of colon: Secondary | ICD-10-CM

## 2021-05-16 DIAGNOSIS — I10 Essential (primary) hypertension: Secondary | ICD-10-CM | POA: Diagnosis not present

## 2021-05-16 DIAGNOSIS — D649 Anemia, unspecified: Secondary | ICD-10-CM

## 2021-05-16 DIAGNOSIS — K219 Gastro-esophageal reflux disease without esophagitis: Secondary | ICD-10-CM

## 2021-05-16 DIAGNOSIS — F1290 Cannabis use, unspecified, uncomplicated: Secondary | ICD-10-CM

## 2021-05-16 HISTORY — DX: Nicotine dependence, unspecified, uncomplicated: F17.200

## 2021-05-16 MED ORDER — LISINOPRIL-HYDROCHLOROTHIAZIDE 20-25 MG PO TABS: 1.0000 | ORAL_TABLET | Freq: Every day | ORAL | 6 refills | Status: DC

## 2021-05-16 MED ORDER — AMLODIPINE BESYLATE 5 MG PO TABS: 5.0000 mg | ORAL_TABLET | Freq: Every day | ORAL | 6 refills | Status: DC

## 2021-05-16 MED ORDER — LORATADINE 10 MG PO TABS: 10.0000 mg | ORAL_TABLET | Freq: Every day | ORAL | 1 refills | Status: AC

## 2021-05-16 MED ORDER — PANTOPRAZOLE SODIUM 20 MG PO TBEC: 20.0000 mg | DELAYED_RELEASE_TABLET | Freq: Every day | ORAL | 1 refills | Status: DC

## 2021-05-16 NOTE — Patient Instructions (Addendum)
Your blood pressure is not controlled.  Continue the lisinopril/hydrochlorothiazide.  We have added another blood pressure medication called amlodipine that she will take once a day.  You have been referred to the gastroenterologist to have your colonoscopy done.  Try setting a quit date to quit smoking and work towards quitting by that date.  High Cholesterol High cholesterol is a condition in which the blood has high levels of a white, waxy substance similar to fat (cholesterol). The liver makes all the cholesterol that the body needs. The human body needs small amounts of cholesterol to help build cells. A person gets extra or excess cholesterol from the food that he or she eats. The blood carries cholesterol from the liver to the rest of the body. If you have high cholesterol, deposits (plaques) may build up on the walls of your arteries. Arteries are the blood vessels that carry blood away from your heart. These plaques make the arteries narrow and stiff. Cholesterol plaques increase your risk for heart attack and stroke. Work with your health care provider to keep your cholesterol levels in a healthy range. What increases the risk? The following factors may make you more likely to develop this condition: Eating foods that are high in animal fat (saturated fat) or cholesterol. Being overweight. Not getting enough exercise. A family history of high cholesterol (familial hypercholesterolemia). Use of tobacco products. Having diabetes. What are the signs or symptoms? In most cases, high cholesterol does not usually cause any symptoms. In severe cases, very high cholesterol levels can cause: Fatty bumps under the skin (xanthomas). A white or gray ring around the black center (pupil) of the eye. How is this diagnosed? This condition may be diagnosed based on the results of a blood test. If you are older than 53 years of age, your health care provider may check your cholesterol levels every  4-6 years. You may be checked more often if you have high cholesterol or other risk factors for heart disease. The blood test for cholesterol measures: "Bad" cholesterol, or LDL cholesterol. This is the main type of cholesterol that causes heart disease. The desired level is less than 100 mg/dL (2.63 mmol/L). "Good" cholesterol, or HDL cholesterol. HDL helps protect against heart disease by cleaning the arteries and carrying the LDL to the liver for processing. The desired level for HDL is 60 mg/dL (7.85 mmol/L) or higher. Triglycerides. These are fats that your body can store or burn for energy. The desired level is less than 150 mg/dL (8.85 mmol/L). Total cholesterol. This measures the total amount of cholesterol in your blood and includes LDL, HDL, and triglycerides. The desired level is less than 200 mg/dL (0.27 mmol/L). How is this treated? Treatment for high cholesterol starts with lifestyle changes, such as diet and exercise. Diet changes. You may be asked to eat foods that have more fiber and less saturated fats or added sugar. Lifestyle changes. These may include regular exercise, maintaining a healthy weight, and quitting use of tobacco products. Medicines. These are given when diet and lifestyle changes have not worked. You may be prescribed a statin medicine to help lower your cholesterol levels. Follow these instructions at home: Eating and drinking  Eat a healthy, balanced diet. This diet includes: Daily servings of a variety of fresh, frozen, or canned fruits and vegetables. Daily servings of whole grain foods that are rich in fiber. Foods that are low in saturated fats and trans fats. These include poultry and fish without skin, lean cuts of  meat, and low-fat dairy products. A variety of fish, especially oily fish that contain omega-3 fatty acids. Aim to eat fish at least 2 times a week. Avoid foods and drinks that have added sugar. Use healthy cooking methods, such as roasting,  grilling, broiling, baking, poaching, steaming, and stir-frying. Do not fry your food except for stir-frying. If you drink alcohol: Limit how much you have to: 0-1 drink a day for women who are not pregnant. 0-2 drinks a day for men. Know how much alcohol is in a drink. In the [place] one drink equals one 12 oz bottle of beer (355 mL), one 5 oz glass of wine (148 mL), or one 1 oz glass of hard liquor (44 mL). Lifestyle  Get regular exercise. Aim to exercise for a total of 150 minutes a week. Increase your activity level by doing activities such as gardening, walking, and taking the stairs. Do not use any products that contain nicotine or tobacco. These products include cigarettes, chewing tobacco, and vaping devices, such as e-cigarettes. If you need help quitting, ask your health care provider. General instructions Take over-the-counter and prescription medicines only as told by your health care provider. Keep all follow-up visits. This is important. Where to find more information American Heart Association: www.heart.org National Heart, Lung, and Blood Institute: [employer].is Contact a health care provider if: You have trouble achieving or maintaining a healthy diet or weight. You are starting an exercise program. You are unable to stop smoking. Get help right away if: You have chest pain. You have trouble breathing. You have discomfort or pain in your jaw, neck, back, shoulder, or arm. You have any symptoms of a stroke. "BE FAST" is an easy way to remember the main warning signs of a stroke: B - Balance. Signs are dizziness, sudden trouble walking, or loss of balance. E - Eyes. Signs are trouble seeing or a sudden change in vision. F - Face. Signs are sudden weakness or numbness of the face, or the face or eyelid drooping on one side. A - Arms. Signs are weakness or numbness in an arm. This happens suddenly and usually on one side of the body. S - Speech. Signs are sudden trouble  speaking, slurred speech, or trouble understanding what people say. T - Time. Time to call emergency services. Write down what time symptoms started. You have other signs of a stroke, such as: A sudden, severe headache with no known cause. Nausea or vomiting. Seizure. These symptoms may represent a serious problem that is an emergency. Do not wait to see if the symptoms will go away. Get medical help right away. Call your local emergency services (911 in the [place] Do not drive yourself to the hospital. Summary Cholesterol plaques increase your risk for heart attack and stroke. Work with your health care provider to keep your cholesterol levels in a healthy range. Eat a healthy, balanced diet, get regular exercise, and maintain a healthy weight. Do not use any products that contain nicotine or tobacco. These products include cigarettes, chewing tobacco, and vaping devices, such as e-cigarettes. Get help right away if you have any symptoms of a stroke. This information is not intended to replace advice given to you by your health care provider. Make sure you discuss any questions you have with your health care provider. Document Revised: 09/22/2020 Document Reviewed: 09/12/2020 Elsevier Patient Education  2022 [employer].
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
## 2021-05-17 LAB — CBC WITH DIFFERENTIAL
Basophils Absolute: 0.1 10*3/uL (ref 0.0–0.2)
Basos: 1 %
EOS (ABSOLUTE): 0 10*3/uL (ref 0.0–0.4)
Eos: 0 %
Hematocrit: 36.4 % — ABNORMAL LOW (ref 37.5–51.0)
Hemoglobin: 12.7 g/dL — ABNORMAL LOW (ref 13.0–17.7)
Immature Grans (Abs): 0 10*3/uL (ref 0.0–0.1)
Immature Granulocytes: 0 %
Lymphocytes Absolute: 2.7 10*3/uL (ref 0.7–3.1)
Lymphs: 25 %
MCH: 33 pg (ref 26.6–33.0)
MCHC: 34.9 g/dL (ref 31.5–35.7)
MCV: 95 fL (ref 79–97)
Monocytes Absolute: 0.7 10*3/uL (ref 0.1–0.9)
Monocytes: 7 %
Neutrophils Absolute: 7.2 10*3/uL — ABNORMAL HIGH (ref 1.4–7.0)
Neutrophils: 67 %
RBC: 3.85 x10E6/uL — ABNORMAL LOW (ref 4.14–5.80)
RDW: 12.3 % (ref 11.6–15.4)
WBC: 10.8 10*3/uL (ref 3.4–10.8)

## 2021-05-17 LAB — LIPID PANEL
Chol/HDL Ratio: 2.9 ratio (ref 0.0–5.0)
Cholesterol, Total: 151 mg/dL (ref 100–199)
HDL: 52 mg/dL (ref >39)
LDL Chol Calc (NIH): 87 mg/dL (ref 0–99)
Triglycerides: 56 mg/dL (ref 0–149)
VLDL Cholesterol Cal: 12 mg/dL (ref 5–40)

## 2021-05-17 LAB — HCV AB W REFLEX TO QUANT PCR: HCV Ab: <0.1 s/co ratio (ref 0.0–0.9)

## 2021-05-17 LAB — TSH: TSH: 0.82 u[IU]/mL (ref 0.450–4.500)

## 2021-05-17 LAB — HEMOGLOBIN A1C
Est. average glucose Bld gHb Est-mCnc: 126 mg/dL
Hgb A1c MFr Bld: 6 % — ABNORMAL HIGH (ref 4.8–5.6)

## 2021-05-17 LAB — HCV INTERPRETATION

## 2021-05-17 NOTE — Addendum Note (Signed)
Addended by: Jonah Blue B on: 05/17/2021 09:36 AM   Modules accepted: Orders

## 2021-05-19 ENCOUNTER — Telehealth: Payer: Self-pay

## 2021-05-19 LAB — IRON,TIBC AND FERRITIN PANEL
Ferritin: 164 ng/mL (ref 30–400)
Iron Saturation: 20 % (ref 15–55)
Iron: 50 ug/dL (ref 38–169)
Total Iron Binding Capacity: 250 ug/dL (ref 250–450)
UIBC: 200 ug/dL (ref 111–343)

## 2021-05-19 LAB — FOLATE: Folate: 8.9 ng/mL (ref >3.0)

## 2021-05-19 LAB — SPECIMEN STATUS REPORT

## 2021-05-19 LAB — VITAMIN B12: Vitamin B-12: 305 pg/mL (ref 232–1245)

## 2021-05-19 NOTE — Telephone Encounter (Signed)
-----   Message from Marcine Matar, MD sent at 05/19/2021  7:53 AM EDT ----- Let patient know that he does not have iron deficiency.  His vitamin B12 level is in the low normal range.  I recommend purchasing vitamin B12 500 mcg over-the-counter and taking 1 daily.

## 2021-05-19 NOTE — Telephone Encounter (Signed)
Call placed to patient and call got disconnected. Attempted to call patient and it goes sriaght to voicemail.  CRM created.

## 2021-05-24 ENCOUNTER — Telehealth: Payer: Self-pay | Admitting: *Deleted

## 2021-05-24 NOTE — Telephone Encounter (Signed)
Reviewed most recent lab results and physician's note with the patient. Provided a reminder of upcoming appointments. No further questions.

## 2021-05-30 ENCOUNTER — Encounter: Payer: Self-pay | Admitting: Pharmacist

## 2021-05-30 ENCOUNTER — Other Ambulatory Visit: Payer: Self-pay

## 2021-05-30 ENCOUNTER — Ambulatory Visit: Payer: 59 | Attending: Internal Medicine | Admitting: Pharmacist

## 2021-05-30 VITALS — BP 157/93 | HR 84

## 2021-05-30 DIAGNOSIS — I10 Essential (primary) hypertension: Secondary | ICD-10-CM

## 2021-05-30 MED ORDER — AMLODIPINE BESYLATE 10 MG PO TABS
10.0000 mg | ORAL_TABLET | Freq: Every day | ORAL | 1 refills | Status: DC
Start: 2021-05-30 — End: 2022-03-25
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
## 2021-06-19 NOTE — Progress Notes (Signed)
   S:    Patient arrives in good spirits.    Presents to the clinic for hypertension evaluation, counseling, and management. Patient was referred and last seen by Primary Care Provider, Dr. Laural Benes, on 05/16/2021. Patient last seen by pharmacy on 05/30/2021 where amlodipine was increased.   Medication adherence reported .  Current BP Medications include:   -Amlodipine 10 mg daily -Lisinopril-hctz 20-25 mg daily  Antihypertensives tried in the past include: none  Dietary habits include: increased appetite. Patient still endorses well rounded intake of vegetables, grains, and chicken/fish for protein. Exercise habits include:Patient reports reduces exercise since shoulder injury. Is awaiting visit with specialist. Family / Social history:  -Reported father currently admitted for GI issues. -Family history of HTN  Patient has been referred to GI for colonoscopy  ASCVD risk factors include: HTN, smoking   O:  Vitals:   06/20/21 1146  BP: (!) 147/85   Home BP readings: <130/80s at home  In clinic reading today 147/85 today without taking any medications this morning.  Spoke with patient about smoking cessation. Patient still plans to quit by [facility] without the use of medications.  Last 3 Office BP readings: BP Readings from Last 3 Encounters:  06/20/21 (!) 147/85  05/30/21 (!) 157/93  05/16/21 (!) 160/100    BMET    Component Value Date/Time   NA 140 05/01/2021 0901   K 3.7 05/01/2021 0901   CL 102 05/01/2021 0901   CO2 27 05/01/2021 0901   GLUCOSE 130 (H) 05/01/2021 0901   BUN 9 05/01/2021 0901   CREATININE 1.16 05/01/2021 0901   CALCIUM 10.1 05/01/2021 0901   GFRNONAA >60 05/01/2021 0901   GFRAA >60 02/24/2020 0626    Renal function: CrCl cannot be calculated (Patient's most recent lab result is older than the maximum 21 days allowed.).  Clinical ASCVD: Yes  The 10-year ASCVD risk score (Arnett DK, et al., 2019) is: 19.5%   Values used to calculate the  score:     Age: 55 years     Sex: Male     Is Non-Hispanic African American: Yes     Diabetic: No     Tobacco smoker: Yes     Systolic Blood Pressure: 147 mmHg     Is BP treated: Yes     HDL Cholesterol: 52 mg/dL     Total Cholesterol: 151 mg/dL   A/P: Hypertension longstanding, currently at goal based on home regimen on current medications. BP Goal = < 130/80 mmHg. Medication adherence reported.  -Continued amlodipine 10 mg daily -Lisinopril-hctz 20-25 mg daily -F/u labs ordered - none at this visit -Counseled on lifestyle modifications for blood pressure control including reduced dietary sodium, increased exercise, adequate sleep.  Results reviewed and written information provided. Total time in face-to-face counseling 30 minutes. F/U Clinic Visit in one month with CPP.   Thank you for allowing pharmacy to participate in this patient's care.  Enos Fling, PharmD PGY1 Pharmacy Resident 06/20/2021 12:04 PM

## 2021-06-20 ENCOUNTER — Other Ambulatory Visit: Payer: Self-pay

## 2021-06-20 ENCOUNTER — Encounter: Payer: Self-pay | Admitting: Pharmacist

## 2021-06-20 ENCOUNTER — Ambulatory Visit: Payer: 59 | Attending: Internal Medicine | Admitting: Pharmacist

## 2021-06-20 VITALS — BP 147/85

## 2021-06-20 DIAGNOSIS — I10 Essential (primary) hypertension: Secondary | ICD-10-CM

## 2021-07-11 ENCOUNTER — Ambulatory Visit: Payer: 59 | Admitting: Pharmacist

## 2021-08-17 ENCOUNTER — Other Ambulatory Visit: Payer: Self-pay

## 2021-08-17 ENCOUNTER — Ambulatory Visit: Payer: Self-pay | Attending: Internal Medicine | Admitting: Internal Medicine

## 2021-08-17 DIAGNOSIS — R918 Other nonspecific abnormal finding of lung field: Secondary | ICD-10-CM

## 2021-08-17 DIAGNOSIS — R634 Abnormal weight loss: Secondary | ICD-10-CM

## 2021-08-17 DIAGNOSIS — R7303 Prediabetes: Secondary | ICD-10-CM

## 2021-08-17 DIAGNOSIS — I10 Essential (primary) hypertension: Secondary | ICD-10-CM

## 2021-08-17 DIAGNOSIS — E538 Deficiency of other specified B group vitamins: Secondary | ICD-10-CM

## 2021-08-17 DIAGNOSIS — D649 Anemia, unspecified: Secondary | ICD-10-CM

## 2021-08-17 DIAGNOSIS — F17200 Nicotine dependence, unspecified, uncomplicated: Secondary | ICD-10-CM

## 2021-08-17 NOTE — Progress Notes (Signed)
Patient ID: Bruce [name], male   DOB: [birth year], 56 y.o.   MRN: [id number] Virtual Visit via Telephone Note  I connected with Bruce [name] on 08/17/2021 at 8:33 AM by telephone and verified that I am speaking with the correct person using two identifiers  Location: Patient: home Provider: office  Participants: Myself Patient   I discussed the limitations, risks, security and privacy concerns of performing an evaluation and management service by telephone and the availability of in person appointments. I also discussed with the patient that there may be a patient responsible charge related to this service. The patient expressed understanding and agreed to proceed.   History of Present Illness: Patient with history of HTN, GERD, tobacco dependence, bilateral lung nodules seen on CT 10/2020, marijuana user, anemia/B12 deficiency, prediabetes.  Last evaluation 05/16/2021.  Purpose of today's visit is chronic disease management.  HTN: On last visit blood pressure was uncontrolled on lisinopril/HCTZ.  Norvasc was added.  He has since saw the clinical pharmacist and dose was increased to 10 mg daily.  Reports compliance with medications.  No personal device to check blood pressure but states he has access to 1 through his girlfriend.  He tries to limit salt in the foods.  No chest pains or shortness of breath.  Weight loss: On last visit patient reported unintentional weight loss.  He states that his baseline weight was around 225 pounds.  On that visit with me he was 206 pounds.  He does not have a scale to weigh himself but feels he has not had any further weight loss.  No blood in the stools.  Reports loose stools off and on.  I had referred him to gastroenterology for EGD and colonoscopy.  It was sent to [facility].  Patient states he has not been contacted as yet. -Now has [employer] and [employer].  Anemia/vitamin B-12 deficiency.  He was found to be anemic on last visit  with hemoglobin of 12.7 and previous level was 17.  Iron studies seemed more consistent with anemia of chronic disease.  Vitamin B12 level was in the low normal range at 305.  I recommended taking B12 supplement.  He has been taking the vitamin B12 500 mcg daily as recommended.  However he tells me he has also been taking iron supplement and a One-A-Day men's vitamin.  Tobacco dependence: Last visit he expressed a desire to quit.  His plan was to cut back gradually.  I encouraged him to set a quit date.  He has not quit as yet.  He states he is down to a half a pack a day and still trying to quit.  He will be due for repeat CAT scan of the chest in April of this year for follow-up on bilateral lung nodules.  Prediabetes: A1c on last visit was 6.  Patient reports he has changed his eating habits.  He no longer eats beef or pork.     Outpatient Encounter Medications as of 08/17/2021  Medication Sig   amLODipine (NORVASC) 10 MG tablet Take 1 tablet (10 mg total) by mouth daily.   lisinopril-hydrochlorothiazide (ZESTORETIC) 20-25 MG tablet Take 1 tablet by mouth daily.   loratadine (CLARITIN) 10 MG tablet Take 1 tablet (10 mg total) by mouth daily. For postnasal drip   ondansetron (ZOFRAN ODT) 4 MG disintegrating tablet Take 1 tablet (4 mg total) by mouth every 8 (eight) hours as needed for nausea or vomiting.   pantoprazole (PROTONIX) 20 MG tablet  Take 1 tablet (20 mg total) by mouth daily.   No facility-administered encounter medications on file as of 08/17/2021.      Observations/Objective: Results for orders placed or performed in visit on 05/16/21  HCV Ab w Reflex to Quant PCR  Result Value Ref Range   HCV Ab <0.1 0.0 - 0.9 s/co ratio  TSH  Result Value Ref Range   TSH 0.820 0.450 - 4.500 uIU/mL  CBC With Differential  Result Value Ref Range   WBC 10.8 3.4 - 10.8 x10E3/uL   RBC 3.85 (L) 4.14 - 5.80 x10E6/uL   Hemoglobin 12.7 (L) 13.0 - 17.7 g/dL   Hematocrit 36.4 (L) 37.5 - 51.0 %    MCV 95 79 - 97 fL   MCH 33.0 26.6 - 33.0 pg   MCHC 34.9 31.5 - 35.7 g/dL   RDW 12.3 11.6 - 15.4 %   Neutrophils 67 Not Estab. %   Lymphs 25 Not Estab. %   Monocytes 7 Not Estab. %   Eos 0 Not Estab. %   Basos 1 Not Estab. %   Neutrophils Absolute 7.2 (H) 1.4 - 7.0 x10E3/uL   Lymphocytes Absolute 2.7 0.7 - 3.1 x10E3/uL   Monocytes Absolute 0.7 0.1 - 0.9 x10E3/uL   EOS (ABSOLUTE) 0.0 0.0 - 0.4 x10E3/uL   Basophils Absolute 0.1 0.0 - 0.2 x10E3/uL   Immature Granulocytes 0 Not Estab. %   Immature Grans (Abs) 0.0 0.0 - 0.1 x10E3/uL  Hemoglobin A1c  Result Value Ref Range   Hgb A1c MFr Bld 6.0 (H) 4.8 - 5.6 %   Est. average glucose Bld gHb Est-mCnc 126 mg/dL  Lipid panel  Result Value Ref Range   Cholesterol, Total 151 100 - 199 mg/dL   Triglycerides 56 0 - 149 mg/dL   HDL 52 >39 mg/dL   VLDL Cholesterol Cal 12 5 - 40 mg/dL   LDL Chol Calc (NIH) 87 0 - 99 mg/dL   Chol/HDL Ratio 2.9 0.0 - 5.0 ratio  Interpretation:  Result Value Ref Range   HCV Interp 1: Comment   Iron, TIBC and Ferritin Panel  Result Value Ref Range   Total Iron Binding Capacity 250 250 - 450 ug/dL   UIBC 200 111 - 343 ug/dL   Iron 50 38 - 169 ug/dL   Iron Saturation 20 15 - 55 %   Ferritin 164 30 - 400 ng/mL  Folate  Result Value Ref Range   Folate 8.9 >3.0 ng/mL  Vitamin B12  Result Value Ref Range   Vitamin B-12 305 232 - 1,245 pg/mL  Specimen status report  Result Value Ref Range   specimen status report Comment      Assessment and Plan: 1. Essential hypertension Advised patient that if he has access to his girlfriend's blood pressure device, he should try to check his blood pressure at least twice a week with goal being 130/80 or lower.  He will continue lisinopril/HCTZ and amlodipine.  2. Tobacco dependence Commended him on cutting back.  I still encouraged him towards smoking cessation.  He declines any medication to help with this.  He states he plans to continue cutting back gradually.  3.  Lung nodules I will have my CMA set this up for April. - CT Chest Wo Contrast; Future  4. Unexplained weight loss Weight has remained stable since last visit per his report.  However he did have a significant drop in hemoglobin when checked on our last visit and compared to hemoglobin of 15.7 in  July of last year. Message sent to our referral coordinator to follow-up on referral sent to gastroenterology.  Patient now has private insurance.  5. Normocytic anemia We will recheck CBC especially since he has been taking iron supplement on his own volition.  Advised to hold iron for now. - CBC; Future  6. Vitamin B12 deficiency Continue B12 supplement 500 mcg daily. - Vitamin B12; Future  7. Prediabetes Discussed and encourage healthy eating habits and regular exercise. - Hemoglobin A1c; Future   Follow Up Instructions: 3 mths   I discussed the assessment and treatment plan with the patient. The patient was provided an opportunity to ask questions and all were answered. The patient agreed with the plan and demonstrated an understanding of the instructions.   The patient was advised to call back or seek an in-person evaluation if the symptoms worsen or if the condition fails to improve as anticipated.  I  Spent 15 minutes on this telephone encounter  This note has been created with Surveyor, quantity. Any transcriptional errors are unintentional.  Karle Plumber, MD

## 2021-08-21 ENCOUNTER — Other Ambulatory Visit: Payer: Self-pay

## 2021-11-15 ENCOUNTER — Ambulatory Visit (HOSPITAL_COMMUNITY): Payer: 59

## 2021-11-16 ENCOUNTER — Ambulatory Visit: Payer: Self-pay | Admitting: Internal Medicine

## 2021-11-28 ENCOUNTER — Encounter (HOSPITAL_COMMUNITY): Payer: Self-pay

## 2021-11-28 ENCOUNTER — Other Ambulatory Visit: Payer: Self-pay

## 2021-11-28 ENCOUNTER — Emergency Department (HOSPITAL_COMMUNITY)
Admission: EM | Admit: 2021-11-28 | Discharge: 2021-11-28 | Disposition: A | Discharge status: Home / Self Care | Payer: 59 | Attending: Student | Admitting: Student

## 2021-11-28 DIAGNOSIS — I10 Essential (primary) hypertension: Secondary | ICD-10-CM | POA: Insufficient documentation

## 2021-11-28 DIAGNOSIS — Z79899 Other long term (current) drug therapy: Secondary | ICD-10-CM | POA: Insufficient documentation

## 2021-11-28 DIAGNOSIS — F17210 Nicotine dependence, cigarettes, uncomplicated: Secondary | ICD-10-CM | POA: Insufficient documentation

## 2021-11-28 DIAGNOSIS — R112 Nausea with vomiting, unspecified: Secondary | ICD-10-CM | POA: Insufficient documentation

## 2021-11-28 LAB — URINALYSIS, ROUTINE W REFLEX MICROSCOPIC
Bilirubin Urine: NEGATIVE
Glucose, UA: NEGATIVE mg/dL
Ketones, ur: 5 mg/dL — AB
Leukocytes,Ua: NEGATIVE
Nitrite: NEGATIVE
Protein, ur: 100 mg/dL — AB
Specific Gravity, Urine: 1.031 — ABNORMAL HIGH (ref 1.005–1.030)
pH: 5 (ref 5.0–8.0)

## 2021-11-28 LAB — CBC
HCT: 48.6 % (ref 39.0–52.0)
Hemoglobin: 17 g/dL (ref 13.0–17.0)
MCH: 34.3 pg — ABNORMAL HIGH (ref 26.0–34.0)
MCHC: 35 g/dL (ref 30.0–36.0)
MCV: 98.2 fL (ref 80.0–100.0)
Platelets: 223 10*3/uL (ref 150–400)
RBC: 4.95 MIL/uL (ref 4.22–5.81)
RDW: 14.3 % (ref 11.5–15.5)
WBC: 15.5 10*3/uL — ABNORMAL HIGH (ref 4.0–10.5)
nRBC: 0 % (ref 0.0–0.2)

## 2021-11-28 LAB — COMPREHENSIVE METABOLIC PANEL
ALT: 19 U/L (ref 0–44)
AST: 22 U/L (ref 15–41)
Albumin: 4.2 g/dL (ref 3.5–5.0)
Alkaline Phosphatase: 63 U/L (ref 38–126)
Anion gap: 9 (ref 5–15)
BUN: 11 mg/dL (ref 6–20)
CO2: 27 mmol/L (ref 22–32)
Calcium: 9.6 mg/dL (ref 8.9–10.3)
Chloride: 106 mmol/L (ref 98–111)
Creatinine, Ser: 1.15 mg/dL (ref 0.61–1.24)
GFR, Estimated: >60 mL/min (ref >60)
Glucose, Bld: 110 mg/dL — ABNORMAL HIGH (ref 70–99)
Potassium: 3.6 mmol/L (ref 3.5–5.1)
Sodium: 142 mmol/L (ref 135–145)
Total Bilirubin: 1.1 mg/dL (ref 0.3–1.2)
Total Protein: 7.5 g/dL (ref 6.5–8.1)

## 2021-11-28 LAB — LIPASE, BLOOD: Lipase: 28 U/L (ref 11–51)

## 2021-11-28 MED ORDER — LACTATED RINGERS IV BOLUS
1000.0000 mL | Freq: Once | INTRAVENOUS | Status: AC
  Administered 2021-11-28: 1000 mL via INTRAVENOUS

## 2021-11-28 MED ORDER — ONDANSETRON 4 MG PO TBDP: 4.0000 mg | ORAL_TABLET | Freq: Three times a day (TID) | ORAL | 0 refills | Status: AC | PRN

## 2021-11-28 MED ORDER — HYDRALAZINE HCL 10 MG PO TABS
10.0000 mg | ORAL_TABLET | Freq: Once | ORAL | Status: DC
Start: 2021-11-28 — End: 2021-11-28

## 2021-11-28 MED ORDER — ONDANSETRON 4 MG PO TBDP: 4.0000 mg | ORAL_TABLET | Freq: Once | ORAL | Status: DC

## 2021-11-28 MED ORDER — ONDANSETRON HCL 4 MG/2ML IJ SOLN
4.0000 mg | Freq: Once | INTRAMUSCULAR | Status: AC
  Administered 2021-11-28: 4 mg via INTRAVENOUS
  Filled 2021-11-28: qty 2

## 2021-11-28 NOTE — ED Notes (Signed)
Pt able to tolerate fluids and food. States ready to go ?

## 2021-11-28 NOTE — ED Provider Triage Note (Signed)
Emergency Medicine Provider Triage Evaluation Note ? ?Anna Steva Ready , a 56 y.o. male  was evaluated in triage.  Pt complains of NV and abd pain since yesterday.  ?Out of BP meds for 5-6 days. No CP or SOB.  ? ? ? ?Review of Systems  ?Positive: Vomiting, nausea, abd pain ?Negative: Fever  ? ?Physical Exam  ?BP (!) 167/111   Pulse 83   Temp 98.6 ?F (37 ?C)   Resp 20   Ht 6' (1.829 m)   Wt 95.3 kg   SpO2 96%   BMI 28.48 kg/m?  ?Gen:   Awake, no distress   ?Resp:  Normal effort  ?MSK:   Moves extremities without difficulty  ?Other:  Abd soft NTTP ? ?Medical Decision Making  ?Medically screening exam initiated at 4:30 PM.  Appropriate orders placed.  Caleb Steva Ready was informed that the remainder of the evaluation will be completed by another provider, this initial triage assessment does not replace that evaluation, and the importance of remaining in the ED until their evaluation is complete. ? ?+Marijuana use ?Abd pain NV ?Hypertensive likely 2/2 forceful emesis ? ?He feels congestion is making him vomit.  ?  Gailen Shelter, [place] ?11/28/21 1633 ? ?

## 2021-11-28 NOTE — ED Triage Notes (Signed)
Pt arrived POV c/o of N/V that started yesterday morning. Pt denies any sick contacts. Pt endorses abdominal pain when he vomits.  ?
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
## 2021-12-18 ENCOUNTER — Encounter (HOSPITAL_COMMUNITY): Payer: Self-pay | Admitting: *Deleted

## 2021-12-18 ENCOUNTER — Ambulatory Visit (HOSPITAL_COMMUNITY)
Admission: EM | Admit: 2021-12-18 | Discharge: 2021-12-18 | Disposition: A | Discharge status: Home / Self Care | Payer: Managed Care, Other (non HMO) | Attending: Sports Medicine | Admitting: Sports Medicine

## 2021-12-18 DIAGNOSIS — B029 Zoster without complications: Secondary | ICD-10-CM

## 2021-12-18 DIAGNOSIS — M542 Cervicalgia: Secondary | ICD-10-CM

## 2021-12-18 DIAGNOSIS — K219 Gastro-esophageal reflux disease without esophagitis: Secondary | ICD-10-CM

## 2021-12-18 DIAGNOSIS — I10 Essential (primary) hypertension: Secondary | ICD-10-CM

## 2021-12-18 MED ORDER — VALACYCLOVIR HCL 1 G PO TABS: 1000.0000 mg | ORAL_TABLET | Freq: Three times a day (TID) | ORAL | 0 refills | Status: AC

## 2021-12-18 MED ORDER — LISINOPRIL-HYDROCHLOROTHIAZIDE 20-25 MG PO TABS: 1.0000 | ORAL_TABLET | Freq: Every day | ORAL | 1 refills | Status: AC

## 2021-12-18 MED ORDER — PANTOPRAZOLE SODIUM 20 MG PO TBEC: 20.0000 mg | DELAYED_RELEASE_TABLET | Freq: Every day | ORAL | 1 refills | Status: AC

## 2021-12-18 NOTE — Discharge Instructions (Addendum)
Take Valtrex 1 tablet (1000mg ) 3 times a day for the next 7 days  I did refill your blood pressure medication in your pantoprazole, be sure to follow-up with your PCP for ongoing
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
## 2022-03-25 ENCOUNTER — Encounter (HOSPITAL_COMMUNITY): Payer: Self-pay | Admitting: Emergency Medicine

## 2022-03-25 ENCOUNTER — Other Ambulatory Visit: Payer: Self-pay

## 2022-03-25 ENCOUNTER — Emergency Department (HOSPITAL_COMMUNITY)
Admission: EM | Admit: 2022-03-25 | Discharge: 2022-03-25 | Disposition: A | Discharge status: Home / Self Care | Payer: Commercial Managed Care - HMO | Attending: Emergency Medicine | Admitting: Emergency Medicine

## 2022-03-25 DIAGNOSIS — R1033 Periumbilical pain: Secondary | ICD-10-CM

## 2022-03-25 DIAGNOSIS — I10 Essential (primary) hypertension: Secondary | ICD-10-CM | POA: Insufficient documentation

## 2022-03-25 DIAGNOSIS — I160 Hypertensive urgency: Secondary | ICD-10-CM | POA: Insufficient documentation

## 2022-03-25 DIAGNOSIS — R109 Unspecified abdominal pain: Secondary | ICD-10-CM | POA: Diagnosis present

## 2022-03-25 LAB — URINALYSIS, ROUTINE W REFLEX MICROSCOPIC
Bacteria, UA: NONE SEEN
Bilirubin Urine: NEGATIVE
Glucose, UA: NEGATIVE mg/dL
Ketones, ur: 20 mg/dL — AB
Leukocytes,Ua: NEGATIVE
Nitrite: NEGATIVE
Protein, ur: 100 mg/dL — AB
Specific Gravity, Urine: 1.028 (ref 1.005–1.030)
pH: 5 (ref 5.0–8.0)

## 2022-03-25 LAB — CBC
HCT: 43.7 % (ref 39.0–52.0)
Hemoglobin: 15.2 g/dL (ref 13.0–17.0)
MCH: 34.4 pg — ABNORMAL HIGH (ref 26.0–34.0)
MCHC: 34.8 g/dL (ref 30.0–36.0)
MCV: 98.9 fL (ref 80.0–100.0)
Platelets: 224 10*3/uL (ref 150–400)
RBC: 4.42 MIL/uL (ref 4.22–5.81)
RDW: 13.2 % (ref 11.5–15.5)
WBC: 9.4 10*3/uL (ref 4.0–10.5)
nRBC: 0 % (ref 0.0–0.2)

## 2022-03-25 LAB — COMPREHENSIVE METABOLIC PANEL
ALT: 18 U/L (ref 0–44)
AST: 22 U/L (ref 15–41)
Albumin: 4 g/dL (ref 3.5–5.0)
Alkaline Phosphatase: 54 U/L (ref 38–126)
Anion gap: 8 (ref 5–15)
BUN: 9 mg/dL (ref 6–20)
CO2: 23 mmol/L (ref 22–32)
Calcium: 9.6 mg/dL (ref 8.9–10.3)
Chloride: 110 mmol/L (ref 98–111)
Creatinine, Ser: 1.06 mg/dL (ref 0.61–1.24)
GFR, Estimated: >60 mL/min (ref >60)
Glucose, Bld: 109 mg/dL — ABNORMAL HIGH (ref 70–99)
Potassium: 3.7 mmol/L (ref 3.5–5.1)
Sodium: 141 mmol/L (ref 135–145)
Total Bilirubin: 0.9 mg/dL (ref 0.3–1.2)
Total Protein: 6.9 g/dL (ref 6.5–8.1)

## 2022-03-25 LAB — LIPASE, BLOOD: Lipase: 49 U/L (ref 11–51)

## 2022-03-25 MED ORDER — AMLODIPINE BESYLATE 10 MG PO TABS: 10.0000 mg | ORAL_TABLET | Freq: Every day | ORAL | 0 refills | Status: AC

## 2022-03-25 MED ORDER — MORPHINE SULFATE (PF) 4 MG/ML IV SOLN
4.0000 mg | Freq: Once | INTRAVENOUS | Status: AC
  Administered 2022-03-25: 4 mg via INTRAVENOUS
  Filled 2022-03-25: qty 1

## 2022-03-25 MED ORDER — SODIUM CHLORIDE 0.9 % IV BOLUS
1000.0000 mL | Freq: Once | INTRAVENOUS | Status: AC
  Administered 2022-03-25: 1000 mL via INTRAVENOUS

## 2022-03-25 MED ORDER — ONDANSETRON HCL 4 MG PO TABS: 4.0000 mg | ORAL_TABLET | Freq: Three times a day (TID) | ORAL | 0 refills | Status: AC | PRN

## 2022-03-25 MED ORDER — AMLODIPINE BESYLATE 5 MG PO TABS
10.0000 mg | ORAL_TABLET | Freq: Once | ORAL | Status: AC
  Administered 2022-03-25: 10 mg via ORAL
  Filled 2022-03-25: qty 2

## 2022-03-25 MED ORDER — ONDANSETRON HCL 4 MG/2ML IJ SOLN
4.0000 mg | Freq: Once | INTRAMUSCULAR | Status: AC
  Administered 2022-03-25: 4 mg via INTRAVENOUS
  Filled 2022-03-25: qty 2

## 2022-03-25 NOTE — Discharge Instructions (Addendum)
You have been evaluated for your abdominal pain.  Fortunately your labs are reassuring.  Please stay hydrated, take Zofran as needed for nausea, you may take over-the-counter Tylenol or ibuprofen as needed for pain.  Avoid marijuana use as it may aggravate your abdominal pain.  Your blood pressure is elevated today, it is important for you to take your blood pressure medication on regular basis.  Follow-up with your doctor for medication refill.

## 2022-03-25 NOTE — ED Provider Notes (Signed)
[facility] EMERGENCY DEPARTMENT Provider Note   CSN: [id number] Arrival date & time: 03/25/22  1001     History  Chief Complaint  Patient presents with   Abdominal Pain    Bruce [name] is a 56 y.o. male.  The history is provided by the patient and medical records. No language interpreter was used.  Abdominal Pain    56 year old male with significant history of poorly control hypertension, intractable nausea vomiting, history of marijuana abuse and tobacco use presenting complaining of abdominal pain.  Patient reported last night he went to sleep but was awoke with pain in his abdomen.  Described pain as a sharp achy throbbing pain throughout his abdomen but more localized towards his periumbilical region, with associated mild nausea.  He has some loose stools when he had a bowel movement but noticed no improvement of his symptoms.  He endorsed some chills as well.  He does not endorse any significant headache chest pain shortness of breath productive cough dysuria hematuria.  He also mention he ran out of his blood pressure medication for the past 2 weeks and was unable to refill it because his clinic is currently closed.  He admits to marijuana use last use was 2 days ago.  Denies any alcohol use.  He has had prior appendectomy  Home Medications Prior to Admission medications   Medication Sig Start Date End Date Taking? Authorizing Provider  amLODipine (NORVASC) 10 MG tablet Take 1 tablet (10 mg total) by mouth daily. 05/30/21   Marcine Matar, MD  diphenhydrAMINE HCl (BENADRYL PO) Take by mouth.    [provider]  lisinopril-hydrochlorothiazide (ZESTORETIC) 20-25 MG tablet Take 1 tablet by mouth daily. 12/18/21   Madelyn Brunner, DO  loratadine (CLARITIN) 10 MG tablet Take 1 tablet (10 mg total) by mouth daily. For postnasal drip 05/16/21   Marcine Matar, MD  ondansetron (ZOFRAN ODT) 4 MG disintegrating tablet Take 1 tablet (4 mg total) by mouth  every 8 (eight) hours as needed for nausea or vomiting. 05/01/21   Lamptey, Britta Mccreedy, MD  ondansetron (ZOFRAN-ODT) 4 MG disintegrating tablet Take 1 tablet (4 mg total) by mouth every 8 (eight) hours as needed for nausea or vomiting. 11/28/21   Kommor, Madison, MD  pantoprazole (PROTONIX) 20 MG tablet Take 1 tablet (20 mg total) by mouth daily. 12/18/21   Madelyn Brunner, DO      Allergies    Penicillins, Beef-derived products, and Pork-derived products    Review of Systems   Review of Systems  Gastrointestinal:  Positive for abdominal pain.  All other systems reviewed and are negative.   Physical Exam Updated Vital Signs BP (!) 191/111 (BP Location: Right Arm)   Pulse 80   Temp 98.4 F (36.9 C) (Oral)   Resp 18   Ht 6' (1.829 m)   Wt 93 kg   SpO2 100%   BMI 27.80 kg/m  Physical Exam Vitals and nursing note reviewed.  Constitutional:      General: He is not in acute distress.    Appearance: He is well-developed.  HENT:     Head: Atraumatic.  Eyes:     Conjunctiva/sclera: Conjunctivae normal.  Cardiovascular:     Rate and Rhythm: Normal rate and regular rhythm.     Heart sounds: Normal heart sounds.  Pulmonary:     Effort: Pulmonary effort is normal.     Breath sounds: Normal breath sounds.  Abdominal:     Palpations: Abdomen is soft.  Tenderness: There is abdominal tenderness in the periumbilical area.  Musculoskeletal:     Cervical back: Neck supple.  Skin:    Findings: No rash.  Neurological:     Mental Status: He is alert.     ED Results / Procedures / Treatments   Labs (all labs ordered are listed, but only abnormal results are displayed) Labs Reviewed  COMPREHENSIVE METABOLIC PANEL - Abnormal; Notable for the following components:      Result Value   Glucose, Bld 109 (*)    All other components within normal limits  CBC - Abnormal; Notable for the following components:   MCH 34.4 (*)    All other components within normal limits  URINALYSIS, ROUTINE W  REFLEX MICROSCOPIC - Abnormal; Notable for the following components:   Hgb urine dipstick SMALL (*)    Ketones, ur 20 (*)    Protein, ur 100 (*)    All other components within normal limits  LIPASE, BLOOD    EKG None  Radiology No results found.  Procedures Procedures    Medications Ordered in ED Medications  amLODipine (NORVASC) tablet 10 mg (10 mg Oral Given 03/25/22 1133)  morphine (PF) 4 MG/ML injection 4 mg (4 mg Intravenous Given 03/25/22 1140)  ondansetron (ZOFRAN) injection 4 mg (4 mg Intravenous Given 03/25/22 1139)  sodium chloride 0.9 % bolus 1,000 mL (1,000 mLs Intravenous New Bag/Given 03/25/22 1138)    ED Course/ Medical Decision Making/ A&P                           Medical Decision Making Amount and/or Complexity of Data Reviewed Labs: ordered.  Risk Prescription drug management.   BP (!) 162/108   Pulse (!) 59   Temp 98.4 F (36.9 C) (Oral)   Resp 18   Ht 6' (1.829 m)   Wt 93 kg   SpO2 95%   BMI 27.80 kg/m   2:05 PM This is a 55 year old male with significant history of marijuana use and history of poorly controlled hypertension as well as history of intractable nausea and vomiting presenting complaint of abdominal pain.  He endorsed having periumbilical abdominal pain ongoing since last night.  He also has some diarrhea and some nausea.  He endorses chills.  He does not endorse any fever chest pain shortness of breath productive cough or dysuria hematuria.  Last marijuana use was several days prior.  On exam, patient is well-appearing appears to be in no acute discomfort.  Mild periumbilical abdominal tenderness on exam without guarding or rebound tenderness.  He has had prior appendectomy therefore I have low suspicion appendicitis.  No significant left lower quadrant abdominal tenderness to suggest colitis or diverticulitis.  No urinary symptoms to suggest UTI.  I have considered cannabinol hyperemesis syndrome however patient is not actively vomiting.   Patient was noted to have elevated blood pressure of 191/111.  He mention he has not had his blood pressure medication for the past 2 weeks because his clinic is temporarily closed.  I will give his home dose blood pressure medication.  2:07 PM Labs obtained independently viewed interpreted by me.  Urinalysis without signs of urine tract infection, normal lipase, electrolyte panels are reassuring, normal WBC, normal H&H.  Patient did receive symptomatic treatment which include Zofran and morphine on reassessment, he reported feeling better.  He is not vomiting.  He is also receiving IV fluid.  I have considered advanced imaging such as abdominal pelvis CT  scan but in setting of her minimal abdominal pain, reassuring labs, I felt CT scan is not indicated at this time.  I have reassessed and rechecked his blood pressure which shows an improvement.  Currently blood pressure is 162/108.  Plan to prescribe a short course of blood pressure medication and strong encourage patient to follow-up with PCP for further managements of his condition.  I have considered patient social determinant of health which includes tobacco use and marijuana use.  Recommend marijuana cessation as it may also contribute to his symptoms.  Return precaution given.        Final Clinical Impression(s) / ED Diagnoses Final diagnoses:  Periumbilical abdominal pain  Hypertensive urgency    Rx / DC Orders ED Discharge Orders          Ordered    amLODipine (NORVASC) 10 MG tablet  Daily        03/25/22 1412    ondansetron (ZOFRAN) 4 MG tablet  Every 8 hours PRN        03/25/22 1412              Fayrene Helper, PA-C 03/25/22 1421    Trifan, Kermit Balo, MD 03/25/22 1535

## 2022-03-25 NOTE — ED Triage Notes (Signed)
Patient arrives ambulatory by POV c/o mid abdominal pain and diarrhea since yesterday afternoon. Patient adds his PCP moved and is unsure where they moved to so he has not had his BP in over a week. Patient reports last use of marijuana 3 days ago.

## 2022-10-14 IMAGING — CT CT ANGIO CHEST
2 of 6 series · 19 of 36 positions shown · IV contrast (omnipaque)
Comparison: None.

CLINICAL DATA: Chest pain and shortness of breath

EXAM:
CT ANGIOGRAPHY CHEST WITH CONTRAST
TECHNIQUE: Multidetector CT imaging of the chest was performed using the
standard protocol during bolus administration of intravenous
contrast. Multiplanar CT image reconstructions and MIPs were
obtained to evaluate the vascular anatomy.
CONTRAST:  75mL OMNIPAQUE IOHEXOL 300 MG/ML  SOLN

[Series 7: pe thins · axial · 0.80mm/px · z∈[+1161,+1434]mm · 18 of 434 slices shown]
[im 22/434  lung]
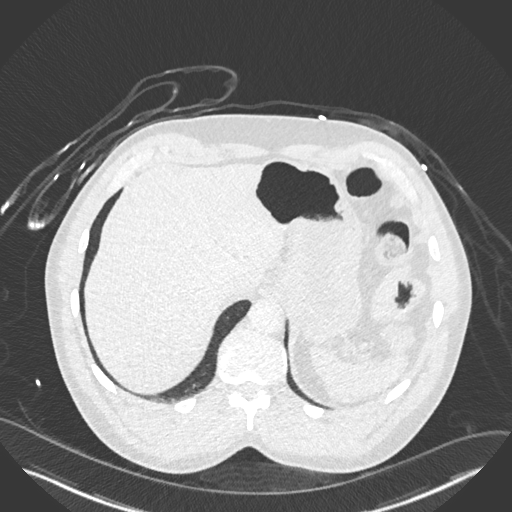
[im 44/434  mediastinal]
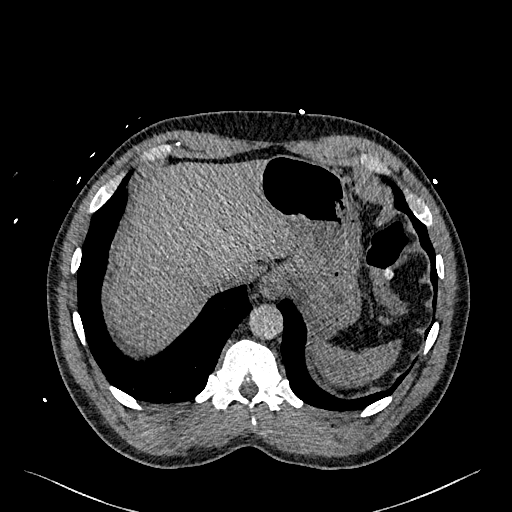
[im 65/434  lung]
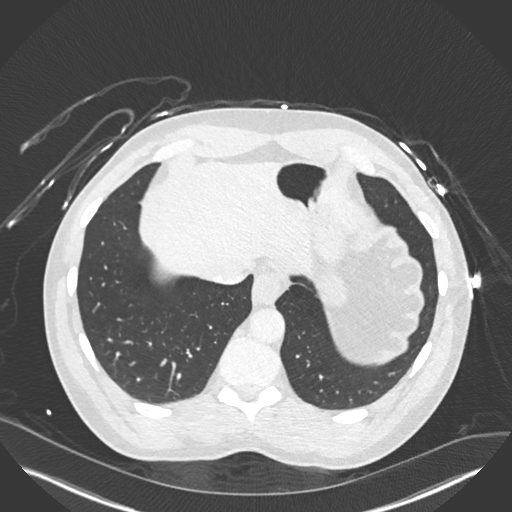
[im 87/434  mediastinal]
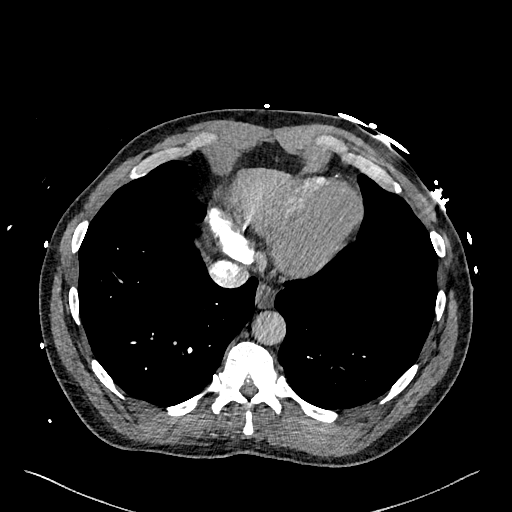
[im 109/434  lung]
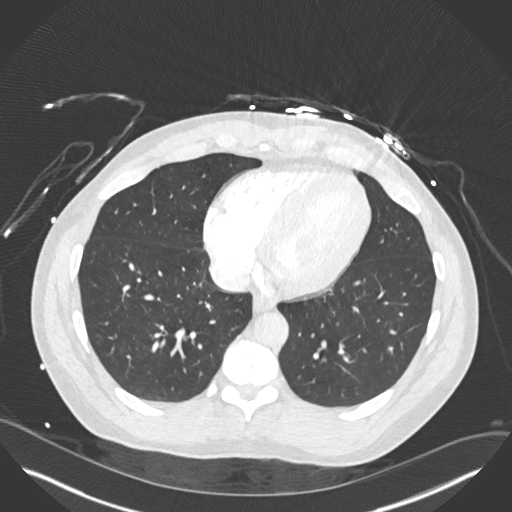
[im 130/434  mediastinal]
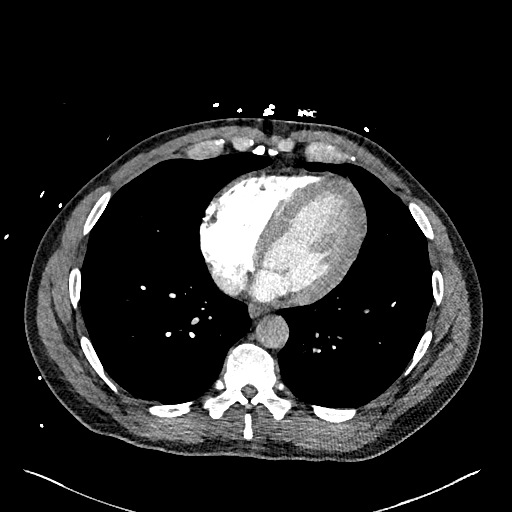
[im 152/434  lung]
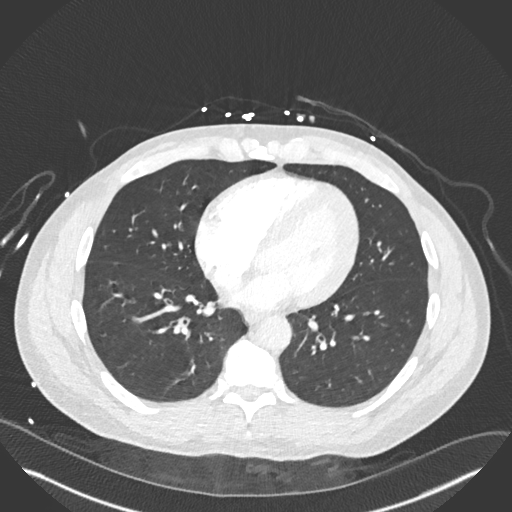
[im 174/434  mediastinal]
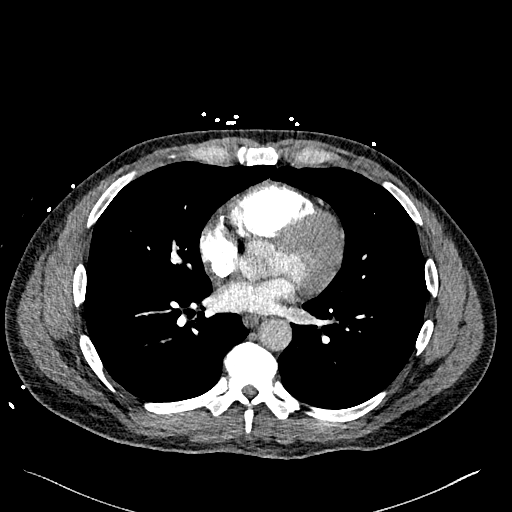
[im 195/434  lung]
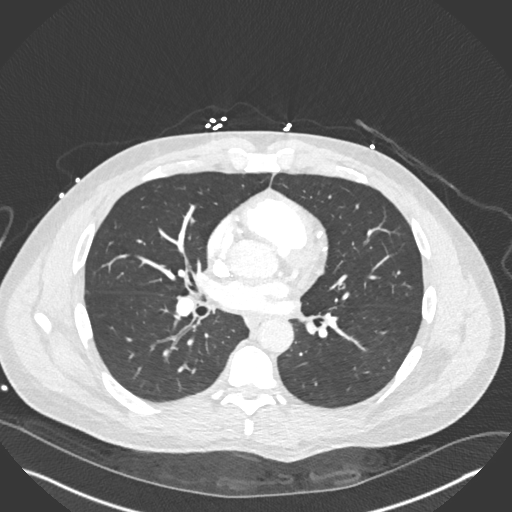
[im 239/434  mediastinal]
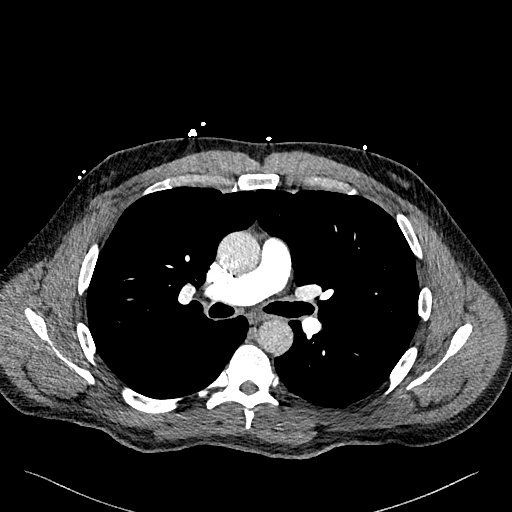
[im 260/434  lung]
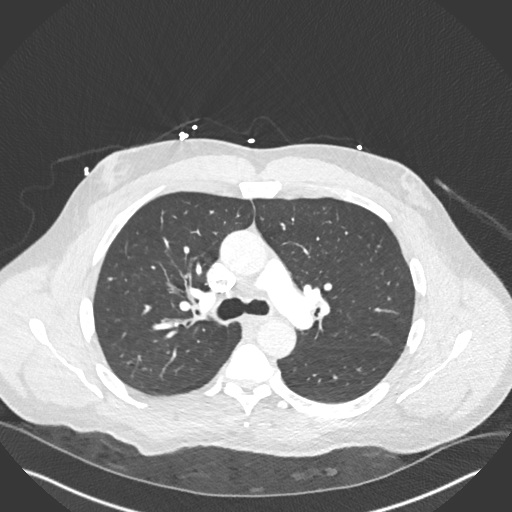
[im 282/434  mediastinal]
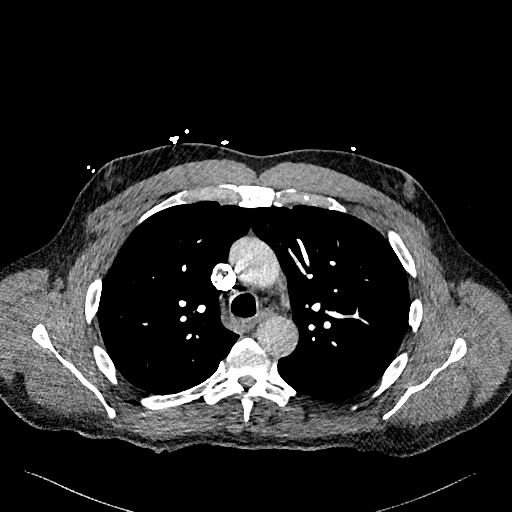
[im 304/434  lung]
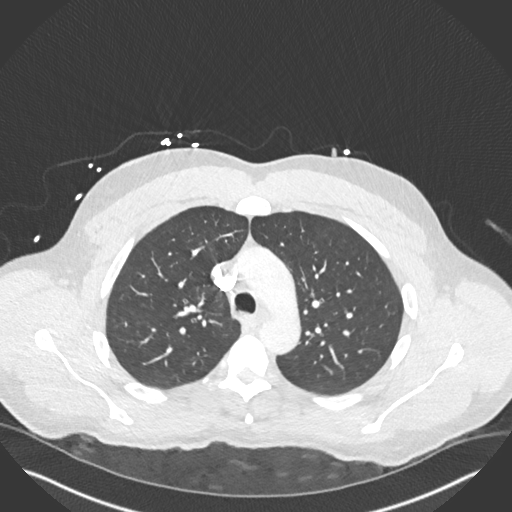
[im 325/434  mediastinal]
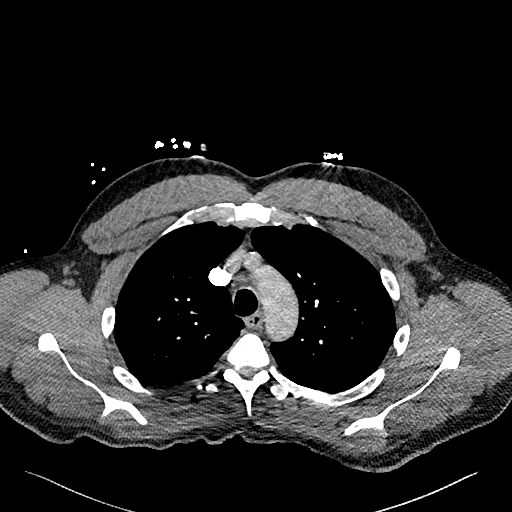
[im 347/434  lung]
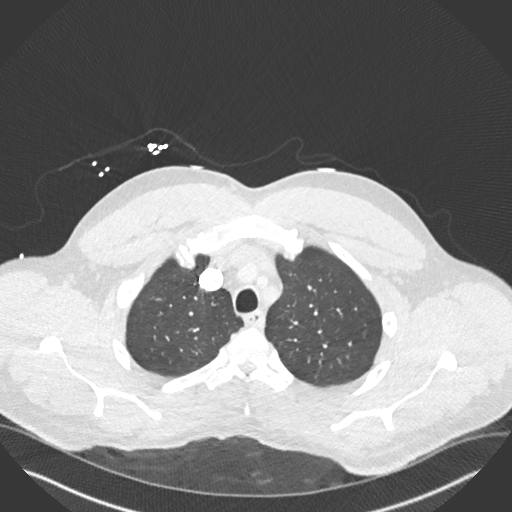
[im 369/434  mediastinal]
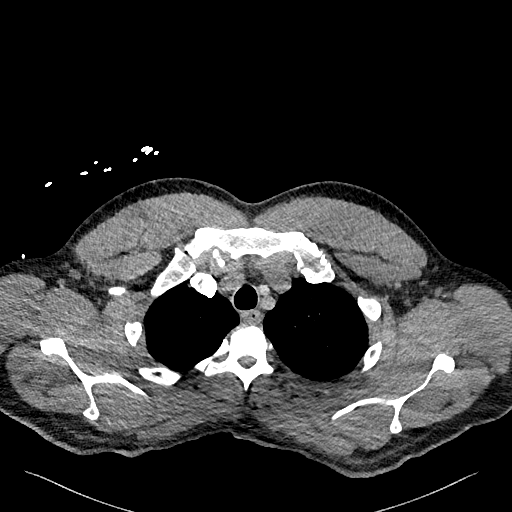
[im 390/434  lung]
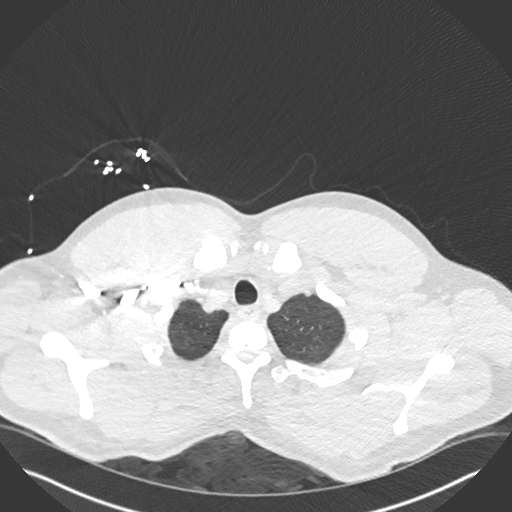
[im 412/434  mediastinal]
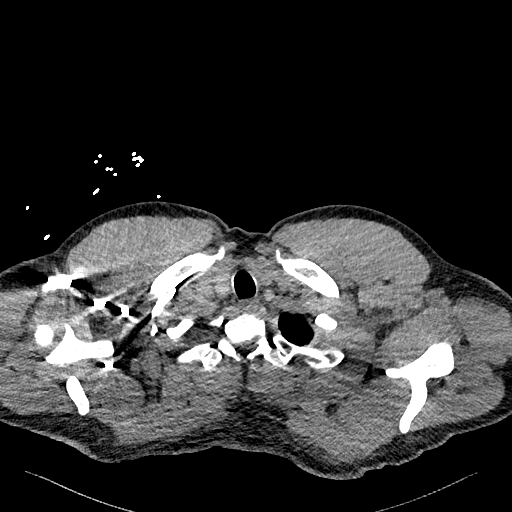

[Series 8: pe 2mm cor · coronal · 0.59mm/px · 1 of 151 slices shown]
[im 76/151  mediastinal]
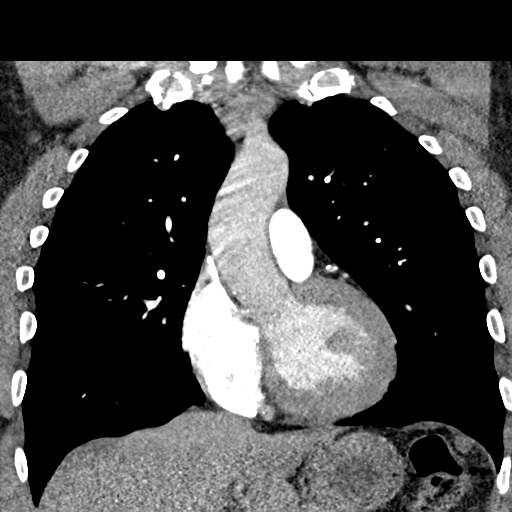

[19 of 36 positions shown; findings below may reference images not displayed]

FINDINGS: Cardiovascular: Atherosclerotic calcifications of the thoracic aorta
are noted without aneurysmal dilatation. Coronary calcifications are
seen. No cardiac enlargement is noted. Pulmonary artery shows a
normal branching pattern bilaterally. No filling defect to suggest
pulmonary embolism is identified.

Mediastinum/Nodes: Thoracic inlet is within normal limits. No hilar
or mediastinal adenopathy is noted. The esophagus as visualized is
within normal limits.

Lungs/Pleura: Lungs are well aerated bilaterally. No focal
infiltrate or sizable effusion is seen. Scattered small subpleural
nodules are noted measuring less than 5 mm bilaterally. No sizable
nodule is seen. No effusion is noted.

Upper Abdomen: Visualized upper abdomen is within normal limits.

Musculoskeletal: No chest wall abnormality. No acute or significant
osseous findings.

Review of the MIP images confirms the above findings.
IMPRESSION: No evidence of pulmonary emboli.

Multiple small less than 5 mm subpleural nodules bilaterally. No
follow-up needed if patient is low-risk (and has no known or
suspected primary neoplasm). Non-contrast chest CT can be considered
in 12 months if patient is high-risk. This recommendation follows
the consensus statement: Guidelines for Management of Incidental
Pulmonary Nodules Detected on CT Images: From the [HOSPITAL]

No other focal abnormality is noted.

Aortic Atherosclerosis ([id number]-OJX.X).

## 2022-10-14 IMAGING — DX DG CHEST 1V PORT
1 series · 1 of 1 positions shown · non-contrast
Comparison: October 02, 2019

CLINICAL DATA: Chest pain

EXAM:
PORTABLE CHEST 1 VIEW

[chest ap]
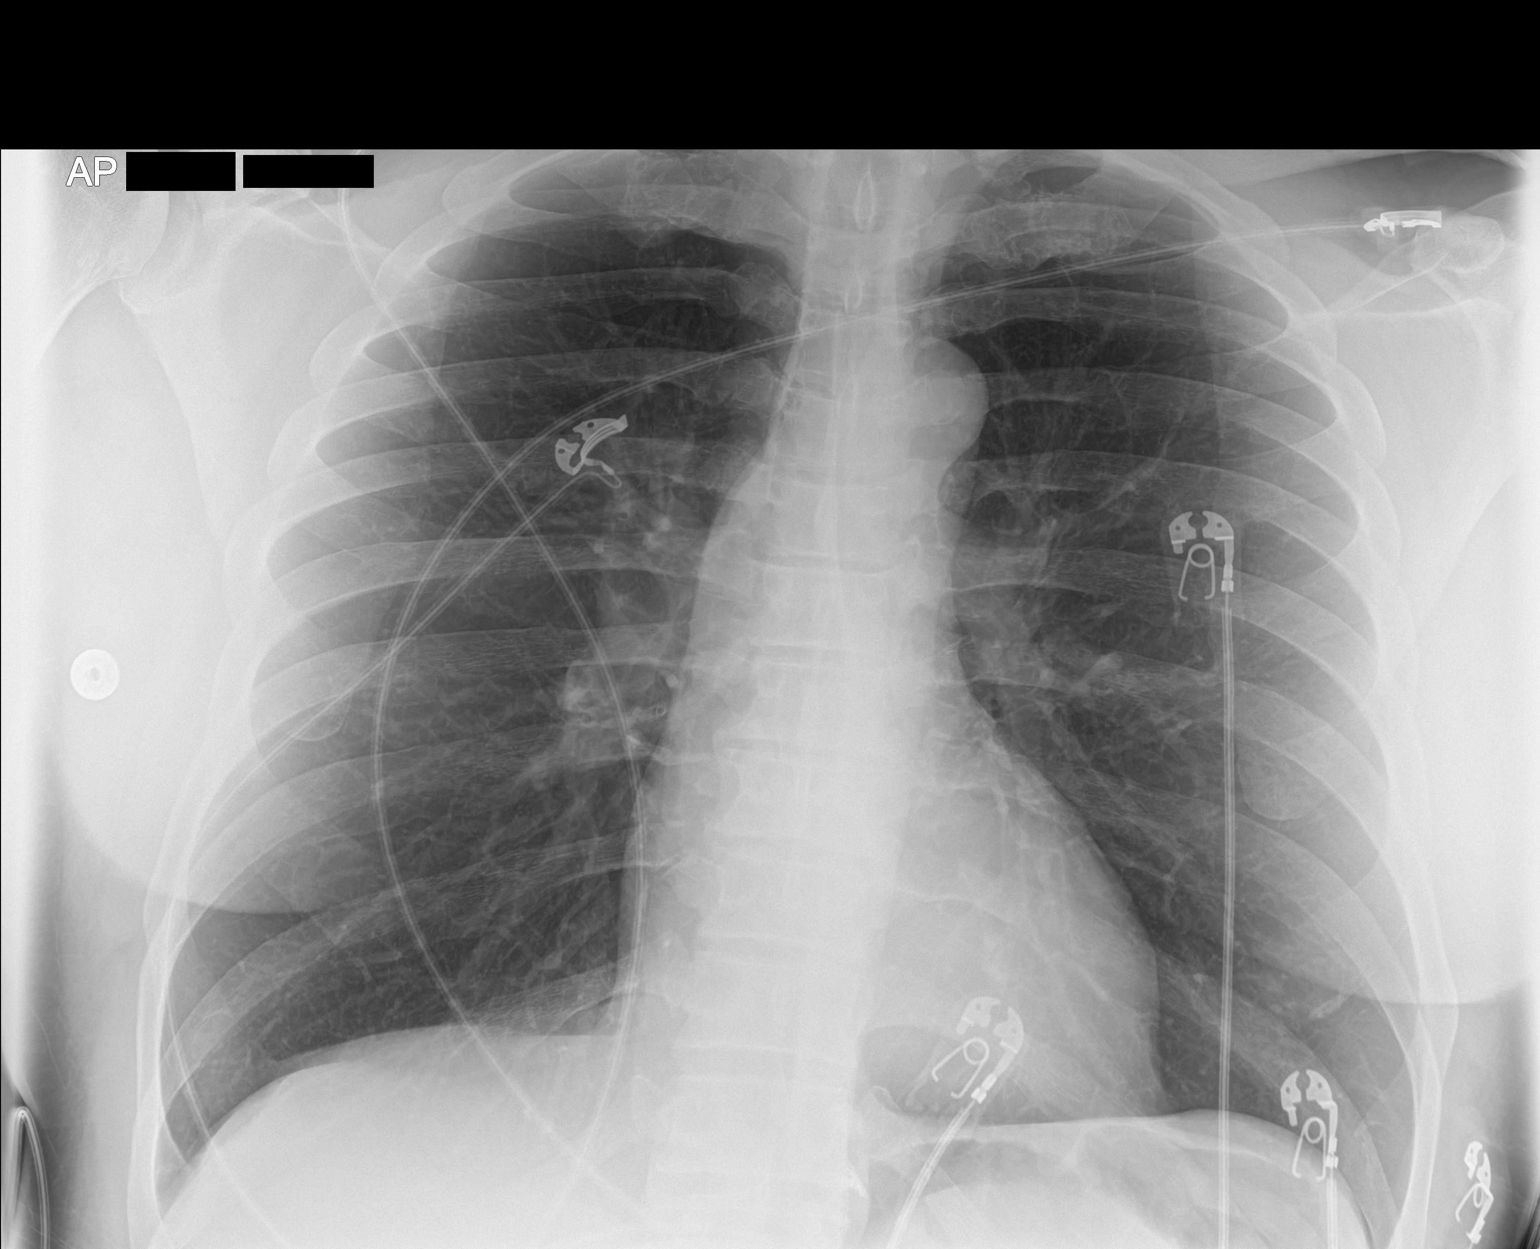

[1 of 1 positions shown; findings below may reference images not displayed]

FINDINGS: Lungs are clear. Heart size and pulmonary vascularity are normal. No
adenopathy. No bone lesions. No pneumothorax.
IMPRESSION: Lungs clear.  Cardiac silhouette normal.

## 2023-02-15 ENCOUNTER — Encounter (HOSPITAL_COMMUNITY): Payer: Self-pay

## 2023-02-15 ENCOUNTER — Other Ambulatory Visit: Payer: Self-pay

## 2023-02-15 ENCOUNTER — Emergency Department (HOSPITAL_COMMUNITY)
Admission: EM | Admit: 2023-02-15 | Discharge: 2023-02-15 | Disposition: A | Discharge status: Home / Self Care | Payer: No Typology Code available for payment source | Attending: Emergency Medicine | Admitting: Emergency Medicine

## 2023-02-15 DIAGNOSIS — R748 Abnormal levels of other serum enzymes: Secondary | ICD-10-CM | POA: Diagnosis not present

## 2023-02-15 DIAGNOSIS — I10 Essential (primary) hypertension: Secondary | ICD-10-CM | POA: Diagnosis present

## 2023-02-15 DIAGNOSIS — Z79899 Other long term (current) drug therapy: Secondary | ICD-10-CM | POA: Insufficient documentation

## 2023-02-15 DIAGNOSIS — Z202 Contact with and (suspected) exposure to infections with a predominantly sexual mode of transmission: Secondary | ICD-10-CM | POA: Insufficient documentation

## 2023-02-15 DIAGNOSIS — E860 Dehydration: Secondary | ICD-10-CM | POA: Insufficient documentation

## 2023-02-15 LAB — URINALYSIS, ROUTINE W REFLEX MICROSCOPIC
Bilirubin Urine: NEGATIVE
Glucose, UA: NEGATIVE mg/dL
Ketones, ur: 20 mg/dL — AB
Leukocytes,Ua: NEGATIVE
Nitrite: NEGATIVE
Protein, ur: 100 mg/dL — AB
Specific Gravity, Urine: 1.031 — ABNORMAL HIGH (ref 1.005–1.030)
pH: 5 (ref 5.0–8.0)

## 2023-02-15 LAB — COMPREHENSIVE METABOLIC PANEL
ALT: 17 U/L (ref 0–44)
AST: 20 U/L (ref 15–41)
Albumin: 4.3 g/dL (ref 3.5–5.0)
Alkaline Phosphatase: 67 U/L (ref 38–126)
Anion gap: 15 (ref 5–15)
BUN: 17 mg/dL (ref 6–20)
CO2: 27 mmol/L (ref 22–32)
Calcium: 9.3 mg/dL (ref 8.9–10.3)
Chloride: 96 mmol/L — ABNORMAL LOW (ref 98–111)
Creatinine, Ser: 1.2 mg/dL (ref 0.61–1.24)
GFR, Estimated: >60 mL/min (ref >60)
Glucose, Bld: 112 mg/dL — ABNORMAL HIGH (ref 70–99)
Potassium: 3.5 mmol/L (ref 3.5–5.1)
Sodium: 138 mmol/L (ref 135–145)
Total Bilirubin: 1.2 mg/dL (ref 0.3–1.2)
Total Protein: 7.2 g/dL (ref 6.5–8.1)

## 2023-02-15 LAB — I-STAT CHEM 8, ED
BUN: 18 mg/dL (ref 6–20)
Calcium, Ion: 1.12 mmol/L — ABNORMAL LOW (ref 1.15–1.40)
Chloride: 100 mmol/L (ref 98–111)
Creatinine, Ser: 1.2 mg/dL (ref 0.61–1.24)
Glucose, Bld: 129 mg/dL — ABNORMAL HIGH (ref 70–99)
HCT: 51 % (ref 39.0–52.0)
Hemoglobin: 17.3 g/dL — ABNORMAL HIGH (ref 13.0–17.0)
Potassium: 3.7 mmol/L (ref 3.5–5.1)
Sodium: 141 mmol/L (ref 135–145)
TCO2: 28 mmol/L (ref 22–32)

## 2023-02-15 LAB — LIPASE, BLOOD: Lipase: 76 U/L — ABNORMAL HIGH (ref 11–51)

## 2023-02-15 LAB — HIV ANTIBODY (ROUTINE TESTING W REFLEX): HIV Screen 4th Generation wRfx: NONREACTIVE

## 2023-02-15 MED ORDER — LISINOPRIL 10 MG PO TABS
10.0000 mg | ORAL_TABLET | Freq: Once | ORAL | Status: AC
  Administered 2023-02-15: 10 mg via ORAL
  Filled 2023-02-15: qty 1

## 2023-02-15 MED ORDER — LACTATED RINGERS IV BOLUS
1000.0000 mL | Freq: Once | INTRAVENOUS | Status: AC
Start: 2023-02-15 — End: 2023-02-15
  Administered 2023-02-15: 1000 mL via INTRAVENOUS

## 2023-02-15 MED ORDER — ONDANSETRON HCL 4 MG/2ML IJ SOLN
4.0000 mg | Freq: Once | INTRAMUSCULAR | Status: AC
  Administered 2023-02-15: 4 mg via INTRAVENOUS
  Filled 2023-02-15: qty 2

## 2023-02-15 MED ORDER — METRONIDAZOLE 500 MG PO TABS
2000.0000 mg | ORAL_TABLET | Freq: Once | ORAL | Status: AC
  Administered 2023-02-15: 2000 mg via ORAL
  Filled 2023-02-15: qty 4

## 2023-02-15 MED ORDER — LISINOPRIL 10 MG PO TABS: 10.0000 mg | ORAL_TABLET | Freq: Every day | ORAL | 3 refills | Status: AC

## 2023-02-15 NOTE — Care Management (Signed)
ED CM consulted concerning PCP needs, patient referred to [facility]. Information placed on the AVS for patient to contact the office as soon as possible to schedule an ED follow up and to establish care.

## 2023-02-15 NOTE — ED Provider Notes (Signed)
[place] EMERGENCY DEPARTMENT AT [facility] Provider Note   CSN: [id number] Arrival date & time: 02/15/23  5110     History  Chief Complaint  Patient presents with   Hypertension   SEXUALLY TRANSMITTED DISEASE    Bruce [name] is a 57 y.o. male.  Patient is a 57 year old male with a history of hypertension, currently unhoused who has been off all medication for the last 5 months who is presenting today with several complaints.  Patient reports 2 days ago he drank 2 shots of liquor and since that time he has felt very sick.  Yesterday he reports vomiting all day long not being able to hold anything down and today he started eating ice chips and drinking Gatorade but has felt overall rundown, some blurry vision, nausea and weakness.  He denies significant hematemesis, diarrhea or fever.  No chest pain cough or congestion.  He also reports that his most recent sexual partner just told him that she had trichomonas.  He denies any dysuria frequency urgency or discharge that he is aware of but did state a few days ago it felt a little unusual in his pelvic area which is now gone.  The history is provided by the patient.  Hypertension       Home Medications Prior to Admission medications   Medication Sig Start Date End Date Taking? Authorizing Provider  lisinopril (ZESTRIL) 10 MG tablet Take 1 tablet (10 mg total) by mouth daily. 02/15/23  Yes Mayuri Staples, Alphonzo Lemmings, MD  amLODipine (NORVASC) 10 MG tablet Take 1 tablet (10 mg total) by mouth daily. 03/25/22   Fayrene Helper, PA-C  diphenhydrAMINE HCl (BENADRYL PO) Take by mouth.    [provider]  lisinopril-hydrochlorothiazide (ZESTORETIC) 20-25 MG tablet Take 1 tablet by mouth daily. 12/18/21   Madelyn Brunner, DO  loratadine (CLARITIN) 10 MG tablet Take 1 tablet (10 mg total) by mouth daily. For postnasal drip 05/16/21   Marcine Matar, MD  ondansetron (ZOFRAN ODT) 4 MG disintegrating tablet Take 1 tablet (4 mg total)  by mouth every 8 (eight) hours as needed for nausea or vomiting. 05/01/21   Lamptey, Britta Mccreedy, MD  ondansetron (ZOFRAN) 4 MG tablet Take 1 tablet (4 mg total) by mouth every 8 (eight) hours as needed for nausea or vomiting. 03/25/22   Fayrene Helper, PA-C  ondansetron (ZOFRAN-ODT) 4 MG disintegrating tablet Take 1 tablet (4 mg total) by mouth every 8 (eight) hours as needed for nausea or vomiting. 11/28/21   Kommor, Madison, MD  pantoprazole (PROTONIX) 20 MG tablet Take 1 tablet (20 mg total) by mouth daily. 12/18/21   Madelyn Brunner, DO      Allergies    Penicillins, Beef-derived products, and Pork-derived products    Review of Systems   Review of Systems  Physical Exam Updated Vital Signs BP (!) 166/95   Pulse 72   Temp 98.7 F (37.1 C) (Oral)   Resp 16   Ht 6' (1.829 m)   Wt 93 kg   SpO2 95%   BMI 27.81 kg/m  Physical Exam Vitals and nursing note reviewed.  Constitutional:      General: He is not in acute distress.    Appearance: He is well-developed.  HENT:     Head: Normocephalic and atraumatic.     Mouth/Throat:     Mouth: Mucous membranes are dry.  Eyes:     Conjunctiva/sclera: Conjunctivae normal.     Pupils: Pupils are equal, round, and reactive to light.  Cardiovascular:     Rate and Rhythm: Normal rate and regular rhythm.     Pulses: Normal pulses.     Heart sounds: No murmur heard. Pulmonary:     Effort: Pulmonary effort is normal. No respiratory distress.     Breath sounds: Normal breath sounds. No wheezing or rales.  Abdominal:     General: There is no distension.     Palpations: Abdomen is soft.     Tenderness: There is no abdominal tenderness. There is no guarding or rebound.  Musculoskeletal:        General: No tenderness. Normal range of motion.     Cervical back: Normal range of motion and neck supple.     Right lower leg: No edema.     Left lower leg: No edema.  Skin:    General: Skin is warm and dry.     Findings: No erythema or rash.  Neurological:      Mental Status: He is alert and oriented to person, place, and time. Mental status is at baseline.  Psychiatric:        Behavior: Behavior normal.     ED Results / Procedures / Treatments   Labs (all labs ordered are listed, but only abnormal results are displayed) Labs Reviewed  URINALYSIS, ROUTINE W REFLEX MICROSCOPIC - Abnormal; Notable for the following components:      Result Value   Color, Urine AMBER (*)    APPearance HAZY (*)    Specific Gravity, Urine 1.031 (*)    Hgb urine dipstick SMALL (*)    Ketones, ur 20 (*)    Protein, ur 100 (*)    Bacteria, UA RARE (*)    All other components within normal limits  COMPREHENSIVE METABOLIC PANEL - Abnormal; Notable for the following components:   Chloride 96 (*)    Glucose, Bld 112 (*)    All other components within normal limits  LIPASE, BLOOD - Abnormal; Notable for the following components:   Lipase 76 (*)    All other components within normal limits  I-STAT CHEM 8, ED - Abnormal; Notable for the following components:   Glucose, Bld 129 (*)    Calcium, Ion 1.12 (*)    Hemoglobin 17.3 (*)    All other components within normal limits  HIV ANTIBODY (ROUTINE TESTING W REFLEX)  RPR  GC/CHLAMYDIA PROBE AMP ([place]) NOT AT [facility]    EKG EKG Interpretation Date/Time:  Friday February 15 2023 10:09:12 EDT Ventricular Rate:  82 PR Interval:  122 QRS Duration:  74 QT Interval:  362 QTC Calculation: 422 R Axis:   80  Text Interpretation: Normal sinus rhythm Right atrial enlargement Nonspecific ST abnormality No significant change since last tracing When compared with ECG of 28-Nov-2021 16:32, PREVIOUS ECG IS PRESENT Confirmed by Gwyneth Sprout ([id number]) on 02/15/2023 4:10:24 PM  Radiology No results found.  Procedures Procedures    Medications Ordered in ED Medications  metroNIDAZOLE (FLAGYL) tablet 2,000 mg (has no administration in time range)  lactated ringers bolus 1,000 mL (0 mLs Intravenous Stopped 02/15/23  1919)  ondansetron (ZOFRAN) injection 4 mg (4 mg Intravenous Given 02/15/23 1649)  lisinopril (ZESTRIL) tablet 10 mg (10 mg Oral Given 02/15/23 1649)    ED Course/ Medical Decision Making/ A&P                             Medical Decision Making Amount and/or Complexity of Data Reviewed Labs: ordered.  Decision-making details documented in ED Course. Radiology: ordered and independent interpretation performed. Decision-making details documented in ED Course. ECG/medicine tests: ordered and independent interpretation performed. Decision-making details documented in ED Course.  Risk Prescription drug management.   Pt with multiple medical problems and comorbidities and presenting today with a complaint that caries a high risk for morbidity and mortality.  Here today with multiple complaints but concern for possible dehydration due to nausea vomiting for the last 24 hours after having alcohol 2 days ago.  Also concern for possible hepatitis or pancreatitis although patient does not have significant abdominal pain at this time.  Concern for AKI and electrolyte abnormalities.  Also patient is concerned that he may have trichomonas due to a partner testing positive that he had been with recently.  He is currently asymptomatic.  He denies any chest pain, shortness of breath, headache or cardiac complaints but is noted to be hypertensive here today.  Patient used to take 20 mg of lisinopril but has not been on the medication for the last 5 months due to losing his insurance and losing his doctor.  He now has all that straightened out and has a Medicaid card.  However he is still not seen a doctor.  Transition of care consult placed so patient can get a PCP.  I independently interpreted patient's EKG and labs.  EKG without acute changes from prior, HIV is negative, UA without acute issues, Chem-8 with stable creatinine of 1.2 normal sodium and potassium.  Will check LFTs and lipase.  Patient given IV fluids,  antiemetic, p.o. challenge.  Also patient given 10 mg of lisinopril. 7:48 PM CMP without acute findings, lipase minimally elevated at 76.  Patient is feeling much better after IV fluids.  Blood pressure is improved with lisinopril and will give patient a prescription and follow-up with an outpatient provider.  Because of his STD exposure he was given 2 g of Flagyl but at this time is asymptomatic.  GC chlamydia urine testing was sent.         Final Clinical Impression(s) / ED Diagnoses Final diagnoses:  Uncontrolled hypertension  Dehydration  STD exposure    Rx / DC Orders ED Discharge Orders          Ordered    lisinopril (ZESTRIL) 10 MG tablet  Daily        02/15/23 1947              Gwyneth Sprout, MD 02/15/23 1948

## 2023-02-15 NOTE — Discharge Instructions (Signed)
You were given a prescription for blood pressure medication and that was sent to the pharmacy also you can call the number above to schedule an appointment so they can continue treating your blood pressure.  You were treated for trichomonas today just in case you were exposed from your partner.  Avoid any sexual contact for the next 2 weeks.  Make sure you are drinking plenty of fluids and avoid alcohol.

## 2023-02-15 NOTE — ED Notes (Signed)
Assumed care of patient here for HTN and STD check who is ambulatory to rr and back with steady gait. Patient a/o x 4 respirations even and non labored BP has improved . Patient pending discharge home.

## 2023-02-15 NOTE — ED Provider Triage Note (Signed)
Emergency Medicine Provider Triage Evaluation Note  Bruce [name] , a 57 y.o. male  was evaluated in triage.  Pt complains of trichomonas exposure.  His girlfriend says that she gave him trichomoniasis.  He wants to be tested for STDs.  He currently does not have any symptoms.  No urinary symptoms or penile discharge.  He also has been out of his blood pressure medicine for a while and his blood pressure has been high.  He had 1 episode of vomiting last night but none since then.  No abdominal pain.  No alcohol use.  No chest pain or shortness of breath..  Review of Systems  Positive: Trichomonas exposure, vomiting last night Negative: Abdominal pain, fever, urinary symptoms, penile discharge, chest pain, shortness of breath  Physical Exam  BP (!) 159/111 (BP Location: Right Arm)   Pulse 98   Temp 98.5 F (36.9 C) (Oral)   Resp 14   Ht 6' (1.829 m)   Wt 93 kg   SpO2 95%   BMI 27.81 kg/m  Gen:   Awake, no distress    Resp:  Normal effort   MSK:   Moves extremities without difficulty   Other:     Medical Decision Making  Medically screening exam initiated at 10:09 AM.  Appropriate orders placed.  Bruce [name] was informed that the remainder of the evaluation will be completed by another provider, this initial triage assessment does not replace that evaluation, and the importance of remaining in the ED until their evaluation is complete.      Rolan Bucco, MD 02/15/23 1010

## 2023-02-15 NOTE — ED Triage Notes (Signed)
Pt c/o HTN. Pt denies HA and dizziness. PT c/o blurred vision. Pt states sexual partner told him she tested positive for trich. Pt c/o N/V. Pt denies penile discharge.
